# Patient Record
Sex: Female | Born: 1995 | Race: Black or African American | Hispanic: No | Marital: Single | Smoking: Never smoker
Health system: Southern US, Community
[De-identification: ages and names within clinical notes are randomized; demographics above are authoritative.]

## PROBLEM LIST (undated history)

## (undated) ENCOUNTER — Inpatient Hospital Stay (HOSPITAL_COMMUNITY): Payer: Self-pay

## (undated) DIAGNOSIS — A64 Unspecified sexually transmitted disease: Secondary | ICD-10-CM

## (undated) DIAGNOSIS — A749 Chlamydial infection, unspecified: Secondary | ICD-10-CM

## (undated) DIAGNOSIS — B009 Herpesviral infection, unspecified: Secondary | ICD-10-CM

## (undated) HISTORY — PX: OTHER SURGICAL HISTORY: SHX169

## (undated) HISTORY — DX: Herpesviral infection, unspecified: B00.9

## (undated) HISTORY — DX: Chlamydial infection, unspecified: A74.9

## (undated) HISTORY — DX: Unspecified sexually transmitted disease: A64

---
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
## 2017-03-22 ENCOUNTER — Telehealth: Payer: Self-pay | Admitting: Obstetrics and Gynecology

## 2017-03-22 NOTE — Telephone Encounter (Signed)
Called and left a message for patient to call back to schedule a new patient doctor referral appointment with our office. °

## 2017-03-28 NOTE — Telephone Encounter (Signed)
Called and left a message for patient to call back to schedule a new patient doctor referral appointment with our office. °

## 2017-04-01 NOTE — Telephone Encounter (Signed)
Called and left a message for patient to call back to schedule a new patient doctor referral appointment with our office. °

## 2017-04-04 NOTE — Telephone Encounter (Signed)
Routing back to referring office patient has not returned calls to schedule.

## 2017-04-07 ENCOUNTER — Ambulatory Visit (INDEPENDENT_AMBULATORY_CARE_PROVIDER_SITE_OTHER): Payer: 59 | Admitting: Obstetrics & Gynecology

## 2017-04-07 ENCOUNTER — Other Ambulatory Visit: Payer: Self-pay

## 2017-04-07 ENCOUNTER — Encounter: Payer: Self-pay | Admitting: Obstetrics & Gynecology

## 2017-04-07 VITALS — BP 110/64 | HR 88 | Resp 16 | Ht 61.25 in | Wt 137.0 lb

## 2017-04-07 DIAGNOSIS — N926 Irregular menstruation, unspecified: Secondary | ICD-10-CM | POA: Diagnosis not present

## 2017-04-07 DIAGNOSIS — Z975 Presence of (intrauterine) contraceptive device: Secondary | ICD-10-CM

## 2017-04-07 NOTE — Progress Notes (Signed)
GYNECOLOGY  VISIT  CC:   Discuss contraception options  HPI: 21 y.o. [id number] Single African American female here for nexplanon consult.  Pt has Nexplanon placed about a year and a half ago.  For the first year, she had amenorrhea.  Now, however, her bleeding has resumed and it is irregular.  She can bleed twice in a month or bleeding/spot for 10-14 days.  Is not predictable.  Frustrating for her.  Also, feels like her Nexplanon has moved since placement.  Wants to know if this is related.  Typical bleeding patterns with Nexplanon reviewed.  Frequency of irregular bleeding discussed.  Options for treatment reviewed as well as removal and replacement versus alternative options reviewed.  Specifically other hormonal methods reviewed as well as barrier method IUD use.  She is considering an IUD.    Pap done 03/15/17.  This was normal per her hx.  Has had STD testing in 3/18.  This was negative.  GYNECOLOGIC HISTORY: Patient's last menstrual period was 01/31/2017 (approximate). Contraception: nexplanon  Menopausal hormone therapy: none  There are no active problems to display for this patient.   Past Medical History:  Diagnosis Date  . Chlamydia   . HSV-1 (herpes simplex virus 1) infection     History reviewed. No pertinent surgical history.  MEDS:   Current Outpatient Medications on File Prior to Visit  Medication Sig Dispense Refill  . etonogestrel (IMPLANON) 68 MG IMPL implant Placed 07/2015    . fluticasone (FLONASE) 50 MCG/ACT nasal spray daily as needed.     No current facility-administered medications on file prior to visit.     ALLERGIES: Penicillins and Sulfa antibiotics  History reviewed. No pertinent family history.  SH:  Single, non smoker  Review of Systems  All other systems reviewed and are negative.   PHYSICAL EXAMINATION:    BP 110/64 (BP Location: Right Arm, Patient Position: Sitting, Cuff Size: Normal)   Pulse 88   Resp 16   Ht 5' 1.25" (1.556 m)   Wt  137 lb (62.1 kg)   LMP 01/31/2017 (Approximate)   BMI 25.68 kg/m     General appearance: alert, cooperative and appears stated age Left arm:  Nexplaonn easily identified in left mararnm     Assessment: Irregular bleeding with Nexpalnon  Plan: Pt will consider options and let me know what she want sto do.

## 2017-04-08 ENCOUNTER — Telehealth: Payer: Self-pay | Admitting: *Deleted

## 2017-04-08 NOTE — Telephone Encounter (Signed)
Detailed message left per DPR letting patient know pap smear [facility]. See scanned copy. Advised to return call if any questions.   Will close encounter.

## 2017-04-10 DIAGNOSIS — Z975 Presence of (intrauterine) contraceptive device: Secondary | ICD-10-CM | POA: Insufficient documentation

## 2017-04-14 ENCOUNTER — Telehealth: Payer: Self-pay | Admitting: Obstetrics & Gynecology

## 2017-04-14 NOTE — Telephone Encounter (Signed)
Spoke with patient regarding benefits for nexplanon removal, possible nexplanon reinsertion and possible Kyleena IUD insertion. Patient understood information presented. Patient states she will call back today to advise which contraceptive device she would like to proceed with and to schedule appointment. Patient does desire to schedule before the end of the year.   cc: Dr Hyacinth MeekerMiller

## 2017-04-19 ENCOUNTER — Telehealth: Payer: Self-pay | Admitting: Obstetrics & Gynecology

## 2017-04-19 DIAGNOSIS — Z3046 Encounter for surveillance of implantable subdermal contraceptive: Secondary | ICD-10-CM

## 2017-04-19 DIAGNOSIS — Z30430 Encounter for insertion of intrauterine contraceptive device: Secondary | ICD-10-CM

## 2017-04-19 NOTE — Telephone Encounter (Signed)
Patient is interested in having her Nexplanon removed and an IUD inserted.

## 2017-04-19 NOTE — Telephone Encounter (Signed)
Left message to call Mckenzi Buonomo at [phone].  

## 2017-04-20 NOTE — Telephone Encounter (Signed)
Patient returning call from Jill. °

## 2017-04-20 NOTE — Telephone Encounter (Signed)
Spoke with patient, scheduled for Nexplanon removal and Kyleena IUD placement on 05/02/17 at 3pm with Dr. Hyacinth MeekerMiller. Advised to take Motrin 800 mg with food and water one hour before procedure. Patient verbalizes understanding and is agreeable.   Orders placed for IUD insertion and implanon removal.  Routing to provider for final review. Patient is agreeable to disposition. Will close encounter.   Cc: Harland DingwallSuzy Dixon

## 2017-04-20 NOTE — Telephone Encounter (Signed)
Left message to call Dalesha Stanback at [phone].  

## 2017-04-20 NOTE — Telephone Encounter (Signed)
Left message to call Efren Kross at [phone].  

## 2017-04-20 NOTE — Telephone Encounter (Signed)
Returning a call to Julie H. °

## 2017-05-02 ENCOUNTER — Encounter: Payer: Self-pay | Admitting: Obstetrics & Gynecology

## 2017-05-02 ENCOUNTER — Ambulatory Visit (INDEPENDENT_AMBULATORY_CARE_PROVIDER_SITE_OTHER): Payer: 59 | Admitting: Obstetrics & Gynecology

## 2017-05-02 VITALS — BP 110/60 | HR 84 | Resp 16 | Ht 61.25 in | Wt 138.0 lb

## 2017-05-02 DIAGNOSIS — Z3046 Encounter for surveillance of implantable subdermal contraceptive: Secondary | ICD-10-CM | POA: Diagnosis not present

## 2017-05-02 DIAGNOSIS — Z308 Encounter for other contraceptive management: Secondary | ICD-10-CM

## 2017-05-02 NOTE — Progress Notes (Signed)
21 yrs African American Singlefemale G1P0010 presents for Nexplanon removal due to DUB.  Does not want to start back on any hormonal therapy at this time.  Just wants to wait and see if cycles straighten out on own.  Knows she does not have any contraception after removal.  Will use condoms.    Questions addressed.  Consent signed. Requests same as above.  Past Medical History:  Diagnosis Date  . Chlamydia 2015  . HSV-1 (herpes simplex virus 1) infection    Current Outpatient Medications on File Prior to Visit  Medication Sig Dispense Refill  . etonogestrel (IMPLANON) 68 MG IMPL implant Placed 07/2015    . fluticasone (FLONASE) 50 MCG/ACT nasal spray daily as needed.     No current facility-administered medications on file prior to visit.    Allergies  Allergen Reactions  . Penicillins Anaphylaxis  . Sulfa Antibiotics Anaphylaxis and Hives   Exam: WNWD AAF, NAD  Procedure: Patient placed supine on exam table with herleft arm flexed at the elbow and externally rotated. The insertion site was identified on the inner side of upper arm. The device was palpated. Incision site for removal was marked. The removal site was cleansed with betadine solution and 2 ml of 1% Lidocaine was injected at the incision site.  Lot: [id number].  Exp:  11/21.  A small 2 mm incision was made at the distal end of the implant. The/Nexaplon implant was grasped with curved forceps and removed gently intact. The implant was shown to the patient and discarded. The incision was closed with a steri strip.  No active bleeding was noted. A small adhesive bandage placed over the removal site.  Pt tolerated procedure well.  Assessment: Nexaplon removal due to DUB Contraception plans are condoms  Plan :Instructions and warning signs and symptoms given.  Pt will remove bandage in 24 hours and let stri-strips come off on own.  Knows to call with any concerns or if changes mind about contraception choices.

## 2017-06-07 ENCOUNTER — Ambulatory Visit (INDEPENDENT_AMBULATORY_CARE_PROVIDER_SITE_OTHER): Payer: 59 | Admitting: Certified Nurse Midwife

## 2017-06-07 ENCOUNTER — Other Ambulatory Visit: Payer: Self-pay

## 2017-06-07 ENCOUNTER — Encounter: Payer: Self-pay | Admitting: Certified Nurse Midwife

## 2017-06-07 VITALS — BP 100/64 | HR 68 | Resp 16 | Ht 61.25 in | Wt 140.0 lb

## 2017-06-07 DIAGNOSIS — N898 Other specified noninflammatory disorders of vagina: Secondary | ICD-10-CM | POA: Diagnosis not present

## 2017-06-07 NOTE — Patient Instructions (Signed)

## 2017-06-07 NOTE — Progress Notes (Signed)
22 y.o. Single African American female G1P0010 here with complaint of vaginal symptoms of vaginal odor, and increase discharge. Describes discharge as white but odorous for the past 4-5 days.  Denies new personal products except for new body wash and then began with symptoms after use.No STD concerns. Urinary symptoms none. Contraception is condoms. No other concerns today.  ROS Pertinent to above  O:Healthy female WDWN Affect: normal, orientation x 3  Exam:Skin: warm and dry Abdomen:soft, non tender  Inguinal Lymph nodes: no enlargement or tenderness Pelvic exam: External genital: normal female, no lesions or scaling or exudate BUS: negative Vagina: beige copious odorous discharge with blood present(end of period) noted.  Affirm taken Cervix: normal, non tender, no CMT Uterus: normal, non tender, no masses Adnexa:normal, non tender, no masses or fullness noted  A:Normal pelvic exam Vaginal discharge increase and odor, new body wash use   P:Discussed findings of normal pelvic exam and vaginal discharge noted and possible new body wash as the  Etiology of odor. Discussed Aveeno or baking soda sitz bath for comfort. Avoid moist clothes or pads for extended period of time. If working out in gym clothes for long periods of time change underwear if possible.  Questions addressed. Lab: Affirm Will call results and treat as indicated.  Rv prn

## 2017-06-08 ENCOUNTER — Other Ambulatory Visit: Payer: Self-pay

## 2017-06-08 LAB — VAGINITIS/VAGINOSIS, DNA PROBE
CANDIDA SPECIES: NEGATIVE
GARDNERELLA VAGINALIS: POSITIVE — AB
Trichomonas vaginosis: NEGATIVE

## 2017-06-08 MED ORDER — METRONIDAZOLE 500 MG PO TABS: 500.0000 mg | ORAL_TABLET | Freq: Two times a day (BID) | ORAL | 0 refills | Status: DC

## 2017-06-30 ENCOUNTER — Encounter: Payer: Self-pay | Admitting: Obstetrics and Gynecology

## 2017-07-25 ENCOUNTER — Emergency Department (HOSPITAL_COMMUNITY): Admission: EM | Admit: 2017-07-25 | Discharge: 2017-07-25 | Discharge status: Against Medical Advice | Payer: 59

## 2017-08-22 ENCOUNTER — Encounter: Payer: Self-pay | Admitting: Obstetrics and Gynecology

## 2017-08-23 ENCOUNTER — Telehealth: Payer: Self-pay | Admitting: Certified Nurse Midwife

## 2017-08-23 ENCOUNTER — Telehealth: Payer: Self-pay | Admitting: Obstetrics and Gynecology

## 2017-08-23 ENCOUNTER — Ambulatory Visit: Payer: Self-pay | Admitting: Obstetrics and Gynecology

## 2017-08-23 ENCOUNTER — Telehealth: Payer: Self-pay | Admitting: Obstetrics & Gynecology

## 2017-08-23 NOTE — Telephone Encounter (Signed)
Patient having severe cramps for the past few days, cycle is not due until Sunday.

## 2017-08-23 NOTE — Telephone Encounter (Signed)
Left message to call Kaitlyn at [phone]. 

## 2017-08-23 NOTE — Telephone Encounter (Signed)
Patient canceled her appointment this afternoon. Patient just scheduled this appointment and has decided to see her PCP the in morning.

## 2017-08-23 NOTE — Telephone Encounter (Addendum)
Requested OV notes and labs from 08/22/17 OV be faxed to [phone].    Routing to provider for final review. Will close encounter.

## 2017-08-23 NOTE — Telephone Encounter (Signed)
Spoke with patient. Reports  squeezing, stabbing, pelvic pain since 08/19/17. Currently 3/10, worse at night. N/V when pain is severe. Tylenol prn, provides some relief. White vaginal d/c, no odor. LMP 07/29/17. SA, no contraceptive. Denies STD concerns.   Seen by Urgent Care on 08/20/17. Reports normal UA, has not received call regarding pregnancy test.   [facility] - swab for STD testing completed, has not been called with results. Pregnancy test negative.   OV scheduled with Dr. Oscar LaJertson on 4/24 at 1:45pm. Advised [facility] and labs. ER precautions provided. Will review with Dr. Oscar LaJertson and return call with any additional recommendations.

## 2017-08-23 NOTE — Telephone Encounter (Signed)
OV notes and labs received from. Reviewed with Dr. Oscar LaJertson, OV recommended for today. Call returned to patient, advised of positive chlamydia result,  OV recommended today for further evaluation and treatment. Patient scheduled for 4:15pm today with Dr. Oscar LaJertson, is aware will be worked into schedule.   Routing to provider for final review. Patient is agreeable to disposition. Will close encounter.

## 2017-08-24 ENCOUNTER — Ambulatory Visit: Payer: 59 | Admitting: Obstetrics and Gynecology

## 2017-08-24 NOTE — Telephone Encounter (Signed)
Left message for patient to call back. Need to be sure patient has been treated for chlamydia and to see how pelvic pain is -eh

## 2017-08-26 NOTE — Telephone Encounter (Signed)
Spoke with patient and her PCP treated her for the chlamydia. She is scheduled to follow up with us in 2 weeks -eh

## 2017-09-08 ENCOUNTER — Ambulatory Visit: Payer: 59 | Admitting: Obstetrics and Gynecology

## 2017-10-05 ENCOUNTER — Telehealth: Payer: Self-pay | Admitting: *Deleted

## 2017-10-05 NOTE — Telephone Encounter (Signed)
Left message to call Noreene LarssonJill at [phone].    [facility]. Treated by PCP, needs to schedule 3 mo f/u with Dr. Oscar LaJertson July 2019.

## 2017-10-05 NOTE — Telephone Encounter (Signed)
Spoke with patient, OV scheduled for 11/02/17 at 4pm with Dr. Oscar LaJertson.   Lab results to scan.  Routing to provider for final review. Patient is agreeable to disposition. Will close encounter.

## 2017-10-18 ENCOUNTER — Telehealth: Payer: Self-pay | Admitting: Obstetrics and Gynecology

## 2017-10-18 NOTE — Telephone Encounter (Signed)
Spoke with patient. Patient previously scheduled for OV for TOC/Chlamydia on 11/02/17. Patient requesting to discuss how chlamydia may effect fertility, "possible scarring" from chlamydia and if any additional testing may be needed in regard to fertility.   Advised patient OV recommended for further discussion with Dr. Oscar LaJertson. Patient declined earlier OV, will discuss at 7/3 OV.   Routing to provider for final review. Patient is agreeable to disposition. Will close encounter.

## 2017-10-18 NOTE — Telephone Encounter (Signed)
Patient has questions for nurse about her upcoming appointment in July.

## 2017-11-02 ENCOUNTER — Ambulatory Visit: Payer: 59 | Admitting: Obstetrics and Gynecology

## 2017-11-07 NOTE — Progress Notes (Deleted)
GYNECOLOGY  VISIT   HPI: 22 y.o.   Single  African American  female   G1P0010 with No LMP recorded.   here for 3 month STD follow up.  GYNECOLOGIC HISTORY: No LMP recorded. Contraception:Condoms Menopausal hormone therapy: None        OB History    Gravida  1   Para      Term      Preterm      AB  1   Living        SAB  1   TAB      Ectopic      Multiple      Live Births                 There are no active problems to display for this patient.   Past Medical History:  Diagnosis Date  . Chlamydia 2015  . HSV-1 (herpes simplex virus 1) infection     Past Surgical History:  Procedure Laterality Date  . nexplanon removed     removed 05-02-17    Current Outpatient Medications  Medication Sig Dispense Refill  . fluticasone (FLONASE) 50 MCG/ACT nasal spray daily as needed.    . metroNIDAZOLE (FLAGYL) 500 MG tablet Take 1 tablet (500 mg total) by mouth 2 (two) times daily. 14 tablet 0   No current facility-administered medications for this visit.      ALLERGIES: Penicillins and Sulfa antibiotics  No family history on file.  Social History   Socioeconomic History  . Marital status: Single    Spouse name: Not on file  . Number of children: Not on file  . Years of education: Not on file  . Highest education level: Not on file  Occupational History  . Not on file  Social Needs  . Financial resource strain: Not on file  . Food insecurity:    Worry: Not on file    Inability: Not on file  . Transportation needs:    Medical: Not on file    Non-medical: Not on file  Tobacco Use  . Smoking status: Never Smoker  . Smokeless tobacco: Never Used  Substance and Sexual Activity  . Alcohol use: Yes    Alcohol/week: 1.8 oz    Types: 3 Standard drinks or equivalent per week    Frequency: Never  . Drug use: No  . Sexual activity: Yes    Partners: Male    Comment: condoms occ  Lifestyle  . Physical activity:    Days per week: Not on file   Minutes per session: Not on file  . Stress: Not on file  Relationships  . Social connections:    Talks on phone: Not on file    Gets together: Not on file    Attends religious service: Not on file    Active member of club or organization: Not on file    Attends meetings of clubs or organizations: Not on file    Relationship status: Not on file  . Intimate partner violence:    Fear of current or ex partner: Not on file    Emotionally abused: Not on file    Physically abused: Not on file    Forced sexual activity: Not on file  Other Topics Concern  . Not on file  Social History Narrative  . Not on file    ROS  PHYSICAL EXAMINATION:    There were no vitals taken for this visit.    General appearance: alert, cooperative and  appears stated age Neck: no adenopathy, supple, symmetrical, trachea midline and thyroid {CHL AMB PHY EX THYROID NORM DEFAULT:[phone]::"normal to inspection and palpation"} Breasts: {Exam; breast:13139::"normal appearance, no masses or tenderness"} Abdomen: soft, non-tender; non distended, no masses,  no organomegaly  Pelvic: External genitalia:  no lesions              Urethra:  normal appearing urethra with no masses, tenderness or lesions              Bartholins and Skenes: normal                 Vagina: normal appearing vagina with normal color and discharge, no lesions              Cervix: {CHL AMB PHY EX CERVIX NORM DEFAULT:[phone]::"no lesions"}              Bimanual Exam:  Uterus:  {CHL AMB PHY EX UTERUS NORM [phone]::"normal size, contour, position, consistency, mobility, non-tender"}              Adnexa: {CHL AMB PHY EX ADNEXA NO MASS DEFAULT:[phone]::"no mass, fullness, tenderness"}              Rectovaginal: {yes no:[id number]}.  Confirms.              Anus:  normal sphincter tone, no lesions  Chaperone was present for exam.  ASSESSMENT     PLAN    An After Visit Summary was printed and given to the patient.  *** minutes  face to face time of which over 50% was spent in counseling.

## 2017-11-08 ENCOUNTER — Ambulatory Visit: Payer: 59 | Admitting: Obstetrics and Gynecology

## 2017-11-16 NOTE — Progress Notes (Signed)
GYNECOLOGY  VISIT   HPI: 22 y.o.   Single  African American  female   G1P0010 with Patient's last menstrual period was 10/25/2017 (exact date).   here for  3 month follow up for positive chlamydia. The patient [facility]. She and her partner were both treated. Not using condoms, using w/d for contraception. Planning on having babies in the next 1-2 years.  She is very worried about scarring of her tubes. Cycles are monthly x 4 days. Saturates a pad in 2-3 x a day. Cramps are tolerable. She has some ovulatory pain.  She has some intermittent pelvic cramping just prior to her cycles. Occasionally at other times. No dyspareunia.     GYNECOLOGIC HISTORY: Patient's last menstrual period was 10/25/2017 (exact date). Contraception:None Menopausal hormone therapy: None        OB History    Gravida  1   Para      Term      Preterm      AB  1   Living        SAB  1   TAB      Ectopic      Multiple      Live Births                 There are no active problems to display for this patient.   Past Medical History:  Diagnosis Date  . Chlamydia 2015  . HSV-1 (herpes simplex virus 1) infection     Past Surgical History:  Procedure Laterality Date  . nexplanon removed     removed 05-02-17    Current Outpatient Medications  Medication Sig Dispense Refill  . fluticasone (FLONASE) 50 MCG/ACT nasal spray daily as needed.    . metroNIDAZOLE (FLAGYL) 500 MG tablet Take 1 tablet (500 mg total) by mouth 2 (two) times daily. 14 tablet 0   No current facility-administered medications for this visit.      ALLERGIES: Penicillins and Sulfa antibiotics  No family history on file.  Social History   Socioeconomic History  . Marital status: Single    Spouse name: Not on file  . Number of children: Not on file  . Years of education: Not on file  . Highest education level: Not on file  Occupational History  . Not on file  Social Needs  . Financial  resource strain: Not on file  . Food insecurity:    Worry: Not on file    Inability: Not on file  . Transportation needs:    Medical: Not on file    Non-medical: Not on file  Tobacco Use  . Smoking status: Never Smoker  . Smokeless tobacco: Never Used  Substance and Sexual Activity  . Alcohol use: Yes    Alcohol/week: 1.8 oz    Types: 3 Standard drinks or equivalent per week    Frequency: Never  . Drug use: No  . Sexual activity: Yes    Partners: Male    Comment: condoms occ  Lifestyle  . Physical activity:    Days per week: Not on file    Minutes per session: Not on file  . Stress: Not on file  Relationships  . Social connections:    Talks on phone: Not on file    Gets together: Not on file    Attends religious service: Not on file    Active member of club or organization: Not on file    Attends meetings of  clubs or organizations: Not on file    Relationship status: Not on file  . Intimate partner violence:    Fear of current or ex partner: Not on file    Emotionally abused: Not on file    Physically abused: Not on file    Forced sexual activity: Not on file  Other Topics Concern  . Not on file  Social History Narrative  . Not on file    Review of Systems  Constitutional: Negative.   HENT: Negative.   Eyes: Negative.   Respiratory: Negative.   Cardiovascular: Negative.   Gastrointestinal: Negative.   Genitourinary: Negative.   Musculoskeletal: Negative.   Skin: Negative.   Neurological: Negative.   Endo/Heme/Allergies: Negative.   Psychiatric/Behavioral: Negative.     PHYSICAL EXAMINATION:    BP 100/70 (BP Location: Right Arm, Patient Position: Sitting)   Pulse 76   Wt 136 lb 12.8 oz (62.1 kg)   LMP 10/25/2017 (Exact Date)   BMI 25.64 kg/m     General appearance: alert, cooperative and appears stated age  Pelvic: External genitalia:  no lesions              Urethra:  normal appearing urethra with no masses, tenderness or lesions               Bartholins and Skenes: normal                 Vagina: normal appearing vagina with normal color, slight increase in creamy, white vaginal d/c (patient declines symptoms             Cervix: no cervical motion tenderness and no lesions              Bimanual Exam:  Uterus:  normal size, contour, position, consistency, mobility, non-tender              Adnexa: no mass, fullness, tenderness               Chaperone was present for exam.  ASSESSMENT H/O chlamydia, s/p treatment STD testing Discussed risks of tubal scarring after chlamydia, discussed risk of ectopic pregnancy and need for early evaluation when pregnant Contraception, only using w/d, okay if she gets pregnant. Declines other contraception    PLAN STD testing Take a multivitamin with folic acid Call with +UPT Discussed that we don't evaluate for tubal damage unless she goes a year with unprotected intercourse without pregnancy   An After Visit Summary was printed and given to the patient.  ~15 minutes face to face time of which over 50% was spent in counseling.

## 2017-11-17 ENCOUNTER — Ambulatory Visit (INDEPENDENT_AMBULATORY_CARE_PROVIDER_SITE_OTHER): Payer: 59 | Admitting: Obstetrics and Gynecology

## 2017-11-17 ENCOUNTER — Encounter: Payer: Self-pay | Admitting: Obstetrics and Gynecology

## 2017-11-17 ENCOUNTER — Other Ambulatory Visit: Payer: Self-pay

## 2017-11-17 VITALS — BP 100/70 | HR 76 | Wt 136.8 lb

## 2017-11-17 DIAGNOSIS — Z8619 Personal history of other infectious and parasitic diseases: Secondary | ICD-10-CM

## 2017-11-17 DIAGNOSIS — Z113 Encounter for screening for infections with a predominantly sexual mode of transmission: Secondary | ICD-10-CM | POA: Diagnosis not present

## 2017-11-17 DIAGNOSIS — Z3009 Encounter for other general counseling and advice on contraception: Secondary | ICD-10-CM | POA: Diagnosis not present

## 2017-11-17 NOTE — Patient Instructions (Signed)
Chlamydia, Female Chlamydia is an STD (sexually transmitted disease). It is a bacterial infection that spreads (is contagious) through sexual contact. Chlamydia can occur in different areas of the body, including:  The tube that moves urine from the bladder out of the body (urethra).  The lower part of the uterus (cervix).  The throat.  The rectum.  This condition is not difficult to treat. However, if left untreated, chlamydia can lead to more serious health problems, including pelvic inflammatory disorder (PID). PID can increase your risk of not being able to have children (sterility). What are the causes? Chlamydia is caused by the bacteria Chlamydia trachomatis. It is passed from an infected partner during sexual activity. Chlamydia can spread through contact with the genitals, mouth, or rectum. What are the signs or symptoms? In some cases, there may not be any symptoms for this condition (asymptomatic), especially early in the infection. If symptoms develop, they may include:  Burning with urination.  Frequent urination.  Vaginal discharge.  Redness, soreness, and swelling (inflammation) of the rectum.  Bleeding or discharge from the rectum.  Abdominal pain.  Pain during sexual intercourse.  Bleeding between menstrual periods.  Itching, burning, or redness in the eyes, or discharge from the eyes.  How is this diagnosed? This condition may be diagnosed with:  Urine tests.  Swab tests. Depending on your symptoms, your health care provider may use a cotton swab to collect discharge from your vagina or rectum to test for the bacteria.  A pelvic exam.  How is this treated? This condition is treated with antibiotic medicines. If you are pregnant, certain types of antibiotics will need to be avoided. Follow these instructions at home: Medicines  Take over-the-counter and prescription medicines only as told by your health care provider.  Take your antibiotic medicine  as told by your health care provider. Do not stop taking the antibiotic even if you start to feel better. Sexual activity  Tell sexual partners about your infection. This includes any oral, anal, or vaginal sex partners you have had within 60 days of when your symptoms started. Sexual partners should also be treated, even if they have no signs of the disease.  Do not have sex until you and your sexual partners have completed treatment and your health care provider says it is okay. If your health care provider prescribed you a single dose treatment, wait 7 days after taking the treatment before having sex. General instructions  It is your responsibility to get your test results. Ask your health care provider, or the department performing the test, when your results will be ready.  Get plenty of rest.  Eat a healthy, well-balanced diet.  Drink enough fluids to keep your urine clear or pale yellow.  Keep all follow-up visits as told by your health care provider. This is important. You may need to be tested for infection again 3 months after treatment. How is this prevented? The only sure way to prevent chlamydia is to avoid having sex. However, you can lower your risk by:  Using latex condoms correctly every time you have sex.  Not having multiple sexual partners.  Asking if your sexual partner has been tested for STIs and had negative results.  Contact a health care provider if:  You develop new symptoms or your symptoms do not get better after completing treatment.  You have a fever or chills.  You have pain during sexual intercourse. Get help right away if:  Your pain gets worse and does   not get better with medicine.  You develop flu-like symptoms, such as night sweats, sore throat, or muscle aches.  You experience nausea or vomiting.  You have difficulty swallowing.  You have bleeding between periods or after sex.  You have irregular menstrual periods.  You have  abdominal or lower back pain that does not get better with medicine.  You feel weak or dizzy, or you faint.  You are pregnant and you develop symptoms of chlamydia. Summary  Chlamydia is an STD (sexually transmitted disease). It is a bacterial infection that spreads (is contagious) through sexual contact.  This condition is not difficult to treat, however. If left untreated, chlamydia can lead to more serious health problems, including pelvic inflammatory disease (PID).  In some cases, there may not be any symptoms for this condition (asymptomatic).  This condition is treated with antibiotic medicines.  Using latex condoms correctly every time you have sex can help prevent chlamydia. This information is not intended to replace advice given to you by your health care provider. Make sure you discuss any questions you have with your health care provider. Document Released: 01/27/2005 Document Revised: 04/05/2016 Document Reviewed: 04/05/2016 Elsevier Interactive Patient Education  2018 [employer]  

## 2017-11-18 LAB — CHLAMYDIA/GONOCOCCUS/TRICHOMONAS, NAA
CHLAMYDIA BY NAA: NEGATIVE
GONOCOCCUS BY NAA: NEGATIVE
Trich vag by NAA: NEGATIVE

## 2017-11-18 LAB — HEP, RPR, HIV PANEL
HEP B S AG: NEGATIVE
HIV SCREEN 4TH GENERATION: NONREACTIVE
RPR Ser Ql: NONREACTIVE

## 2017-11-18 LAB — HEPATITIS C ANTIBODY

## 2018-01-09 ENCOUNTER — Telehealth: Payer: Self-pay | Admitting: Obstetrics and Gynecology

## 2018-01-09 ENCOUNTER — Ambulatory Visit (INDEPENDENT_AMBULATORY_CARE_PROVIDER_SITE_OTHER): Payer: 59 | Admitting: Obstetrics and Gynecology

## 2018-01-09 ENCOUNTER — Encounter: Payer: Self-pay | Admitting: Obstetrics and Gynecology

## 2018-01-09 VITALS — BP 116/80 | HR 84 | Resp 14 | Wt 140.0 lb

## 2018-01-09 DIAGNOSIS — R61 Generalized hyperhidrosis: Secondary | ICD-10-CM

## 2018-01-09 DIAGNOSIS — N940 Mittelschmerz: Secondary | ICD-10-CM | POA: Diagnosis not present

## 2018-01-09 DIAGNOSIS — Z113 Encounter for screening for infections with a predominantly sexual mode of transmission: Secondary | ICD-10-CM

## 2018-01-09 DIAGNOSIS — R140 Abdominal distension (gaseous): Secondary | ICD-10-CM

## 2018-01-09 DIAGNOSIS — R110 Nausea: Secondary | ICD-10-CM | POA: Diagnosis not present

## 2018-01-09 DIAGNOSIS — R102 Pelvic and perineal pain: Secondary | ICD-10-CM

## 2018-01-09 LAB — POCT URINE PREGNANCY: Preg Test, Ur: NEGATIVE

## 2018-01-09 NOTE — Telephone Encounter (Signed)
Patient called requesting to speak with a nurse about some symptoms she is having including which include night sweats and also bad cramps around her menstrual cycle.

## 2018-01-09 NOTE — Progress Notes (Signed)
GYNECOLOGY  VISIT   HPI: 22 y.o.   Single  African American  female   G1P0010 with Patient's last menstrual period was 12/25/2017.   here for nausea, increase pain and bloating during menses.    She has been off of contraception in 1/19. Cycles q month x 4-5 days. Saturates a regular tampon 3-4 x a day. Cramps are tolerable.  She c/o midcyle pain, getting worse each month for the last few months. The pain is associated with bloating and nausea. This month the pain was on the left, not sure about before. Symptoms last a couple of days. The pain is constant for a day or so, up to a 5/10 in severity. Ibuprofen helps the pain. Right prior to her cycle she can have some nausea and some intermittent cramps.  Using w/d for contraception, okay if she does get pregnant.  She c/o some night sweats in the last week. No fevers. Same sexual partner, negative STD testing in 7/19. She would like repeat cervical cultures  GYNECOLOGIC HISTORY: Patient's last menstrual period was 12/25/2017. Contraception:none Menopausal hormone therapy: none        OB History    Gravida  1   Para      Term      Preterm      AB  1   Living        SAB  1   TAB      Ectopic      Multiple      Live Births                 There are no active problems to display for this patient.   Past Medical History:  Diagnosis Date  . Chlamydia 2015  . HSV-1 (herpes simplex virus 1) infection   . STD (sexually transmitted disease)     Past Surgical History:  Procedure Laterality Date  . nexplanon removed     removed 05-02-17    Current Outpatient Medications  Medication Sig Dispense Refill  . fluticasone (FLONASE) 50 MCG/ACT nasal spray daily as needed.     No current facility-administered medications for this visit.      ALLERGIES: Penicillins and Sulfa antibiotics  No family history on file.  Social History   Socioeconomic History  . Marital status: Single    Spouse name: Not on file  .  Number of children: Not on file  . Years of education: Not on file  . Highest education level: Not on file  Occupational History  . Not on file  Social Needs  . Financial resource strain: Not on file  . Food insecurity:    Worry: Not on file    Inability: Not on file  . Transportation needs:    Medical: Not on file    Non-medical: Not on file  Tobacco Use  . Smoking status: Never Smoker  . Smokeless tobacco: Never Used  Substance and Sexual Activity  . Alcohol use: Yes    Alcohol/week: 3.0 standard drinks    Types: 3 Standard drinks or equivalent per week    Frequency: Never  . Drug use: No  . Sexual activity: Yes    Partners: Male    Birth control/protection: None  Lifestyle  . Physical activity:    Days per week: Not on file    Minutes per session: Not on file  . Stress: Not on file  Relationships  . Social connections:    Talks on phone: Not on file  Gets together: Not on file    Attends religious service: Not on file    Active member of club or organization: Not on file    Attends meetings of clubs or organizations: Not on file    Relationship status: Not on file  . Intimate partner violence:    Fear of current or ex partner: Not on file    Emotionally abused: Not on file    Physically abused: Not on file    Forced sexual activity: Not on file  Other Topics Concern  . Not on file  Social History Narrative  . Not on file    Review of Systems  Constitutional: Negative.   HENT: Negative.   Eyes: Negative.   Respiratory: Negative.   Cardiovascular: Negative.   Gastrointestinal:       Bloating pain  Genitourinary: Negative.   Musculoskeletal: Negative.   Skin: Negative.   Neurological: Negative.   Endo/Heme/Allergies:       Excessive sweating  Psychiatric/Behavioral: Negative.   All other systems reviewed and are negative.   PHYSICAL EXAMINATION:    Wt 140 lb (63.5 kg)   LMP 12/25/2017   BMI 26.24 kg/m     General appearance: alert,  cooperative and appears stated age Neck: no adenopathy, supple, symmetrical, trachea midline and thyroid normal to inspection and palpation Abdomen: soft, mildly tender LLQ, no rebound, no guarding; non distended, no masses,  no organomegaly  Pelvic: External genitalia:  no lesions              Urethra:  normal appearing urethra with no masses, tenderness or lesions              Bartholins and Skenes: normal                 Vagina: normal appearing vagina with normal color and discharge, no lesions              Cervix: no cervical motion tenderness and no lesions              Bimanual Exam:  Uterus:  normal size, contour, position, consistency, mobility, non-tender              Adnexa: no masses, tender in the left adnexa                Chaperone was present for exam.  ASSESSMENT Mid cycle pain for the last several months Pre-menstrual cramps Intermittent nausea, bloating Screening std Contraception, only using W/D, declines other options    PLAN Calendar pain, f/u in 3 months, sooner if pain is worsening or persistent. We discussed possible ultrasound Take ibuprofen, use heating pad as needed Declines OCP's Take PNV  Genprobe, HIV, RPR UPT negative   An After Visit Summary was printed and given to the patient.  ~25 minutes face to face time of which over 50% was spent in counseling.

## 2018-01-09 NOTE — Telephone Encounter (Signed)
Spoke with patient. Reports nausea, increased sweating during day and night, increased pain and bloating with ovulation. Ongoing for last 2-3 months, worse last night, 5/10. Took 200 mg motrin and was able to sleep. LMP 8/25-8/29, describes flow as normal. Not currently trying for pregnancy, no contraceptive.  Has not taken UPT.   Denies fever/chills, vomiting, vaginal d/c or odor.   Recommended OV for further evaluation, OV scheduled for today at 3:15pm with Dr. Oscar La. Patient verbalizes understanding and is agreeable. Encounter closed.

## 2018-01-10 LAB — CBC WITH DIFFERENTIAL/PLATELET
BASOS: 0 %
Basophils Absolute: 0 10*3/uL (ref 0.0–0.2)
EOS (ABSOLUTE): 0 10*3/uL (ref 0.0–0.4)
EOS: 0 %
HEMATOCRIT: 34.1 % (ref 34.0–46.6)
HEMOGLOBIN: 11.4 g/dL (ref 11.1–15.9)
Immature Grans (Abs): 0 10*3/uL (ref 0.0–0.1)
Immature Granulocytes: 0 %
LYMPHS ABS: 3.1 10*3/uL (ref 0.7–3.1)
Lymphs: 59 %
MCH: 23.2 pg — AB (ref 26.6–33.0)
MCHC: 33.4 g/dL (ref 31.5–35.7)
MCV: 69 fL — ABNORMAL LOW (ref 79–97)
MONOCYTES: 7 %
Monocytes Absolute: 0.4 10*3/uL (ref 0.1–0.9)
Neutrophils Absolute: 1.8 10*3/uL (ref 1.4–7.0)
Neutrophils: 34 %
Platelets: 184 10*3/uL (ref 150–450)
RBC: 4.92 x10E6/uL (ref 3.77–5.28)
RDW: 16.1 % — ABNORMAL HIGH (ref 12.3–15.4)
WBC: 5.4 10*3/uL (ref 3.4–10.8)

## 2018-01-10 LAB — RPR: RPR Ser Ql: NONREACTIVE

## 2018-01-10 LAB — HIV ANTIBODY (ROUTINE TESTING W REFLEX): HIV SCREEN 4TH GENERATION: NONREACTIVE

## 2018-01-10 LAB — TSH: TSH: 0.902 u[IU]/mL (ref 0.450–4.500)

## 2018-01-10 LAB — GC/CHLAMYDIA PROBE AMP
Chlamydia trachomatis, NAA: NEGATIVE
Neisseria gonorrhoeae by PCR: NEGATIVE

## 2018-01-12 ENCOUNTER — Telehealth: Payer: Self-pay | Admitting: Obstetrics and Gynecology

## 2018-01-12 NOTE — Telephone Encounter (Signed)
Patient called to check on the status of her recent test results from Monday, 01/09/18.

## 2018-01-12 NOTE — Telephone Encounter (Signed)
Dr. Oscar LaJertson -please review labs dated 01/09/18 and advise.

## 2018-01-12 NOTE — Telephone Encounter (Signed)
Normal labs, result note routed to you.

## 2018-01-12 NOTE — Telephone Encounter (Signed)
Left detailed message, ok per dpr. Advised or normal results. Return call to office if any additional questions. Encounter closed.

## 2018-03-13 ENCOUNTER — Other Ambulatory Visit: Payer: Self-pay

## 2018-03-13 ENCOUNTER — Telehealth: Payer: Self-pay | Admitting: Obstetrics and Gynecology

## 2018-03-13 ENCOUNTER — Encounter: Payer: Self-pay | Admitting: Obstetrics and Gynecology

## 2018-03-13 ENCOUNTER — Ambulatory Visit (INDEPENDENT_AMBULATORY_CARE_PROVIDER_SITE_OTHER): Payer: 59 | Admitting: Obstetrics and Gynecology

## 2018-03-13 VITALS — BP 122/72 | HR 84 | Wt 139.0 lb

## 2018-03-13 DIAGNOSIS — R102 Pelvic and perineal pain: Secondary | ICD-10-CM | POA: Diagnosis not present

## 2018-03-13 DIAGNOSIS — N3090 Cystitis, unspecified without hematuria: Secondary | ICD-10-CM

## 2018-03-13 DIAGNOSIS — N920 Excessive and frequent menstruation with regular cycle: Secondary | ICD-10-CM | POA: Diagnosis not present

## 2018-03-13 DIAGNOSIS — N898 Other specified noninflammatory disorders of vagina: Secondary | ICD-10-CM | POA: Diagnosis not present

## 2018-03-13 LAB — POCT URINALYSIS DIPSTICK
BILIRUBIN UA: NEGATIVE
Blood, UA: POSITIVE
GLUCOSE UA: NEGATIVE
Ketones, UA: NEGATIVE
Nitrite, UA: NEGATIVE
Protein, UA: NEGATIVE
Spec Grav, UA: 1.01 (ref 1.010–1.025)
Urobilinogen, UA: 0.2 E.U./dL
pH, UA: 5 (ref 5.0–8.0)

## 2018-03-13 MED ORDER — PHENAZOPYRIDINE HCL 200 MG PO TABS: 200.0000 mg | ORAL_TABLET | Freq: Three times a day (TID) | ORAL | 0 refills | Status: DC | PRN

## 2018-03-13 MED ORDER — NITROFURANTOIN MONOHYD MACRO 100 MG PO CAPS: 100.0000 mg | ORAL_CAPSULE | Freq: Two times a day (BID) | ORAL | 0 refills | Status: DC

## 2018-03-13 NOTE — Progress Notes (Signed)
GYNECOLOGY  VISIT   HPI: 22 y.o.   Single Black or African American Not Hispanic or Latino  female   G1P0010 with Patient's last menstrual period was 02/20/2018 (exact date).   here for UTI symptoms and vaginal discharge.  She c/o a 1 week h/o dysuria, frequency, and urgency. Symptoms got much worse over the weekend. She just had some spotting yesterday and the day before, pink. She also c/o some increase in white vaginal d/c in the last week. No itching, burning or irritation. Cycles have been every 28 days, LMP 02/20/18 (normal). Slight pelvic cramping constantly since yesterday, 2/10 in severity. She is with the same partner x 1.5 years, live together. Negative STD testing in 9/19.  Prior pelvic pain is gone.   GYNECOLOGIC HISTORY: Patient's last menstrual period was 02/20/2018 (exact date). Contraception:None Menopausal hormone therapy: None        OB History    Gravida  1   Para      Term      Preterm      AB  1   Living        SAB  1   TAB      Ectopic      Multiple      Live Births                 There are no active problems to display for this patient.   Past Medical History:  Diagnosis Date  . Chlamydia 2015  . HSV-1 (herpes simplex virus 1) infection   . STD (sexually transmitted disease)     Past Surgical History:  Procedure Laterality Date  . nexplanon removed     removed 05-02-17    No current outpatient medications on file.   No current facility-administered medications for this visit.      ALLERGIES: Penicillins and Sulfa antibiotics  History reviewed. No pertinent family history.  Social History   Socioeconomic History  . Marital status: Single    Spouse name: Not on file  . Number of children: Not on file  . Years of education: Not on file  . Highest education level: Not on file  Occupational History  . Not on file  Social Needs  . Financial resource strain: Not on file  . Food insecurity:    Worry: Not on file   Inability: Not on file  . Transportation needs:    Medical: Not on file    Non-medical: Not on file  Tobacco Use  . Smoking status: Never Smoker  . Smokeless tobacco: Never Used  Substance and Sexual Activity  . Alcohol use: Yes    Alcohol/week: 3.0 standard drinks    Types: 3 Standard drinks or equivalent per week    Frequency: Never  . Drug use: No  . Sexual activity: Yes    Partners: Male    Birth control/protection: None  Lifestyle  . Physical activity:    Days per week: Not on file    Minutes per session: Not on file  . Stress: Not on file  Relationships  . Social connections:    Talks on phone: Not on file    Gets together: Not on file    Attends religious service: Not on file    Active member of club or organization: Not on file    Attends meetings of clubs or organizations: Not on file    Relationship status: Not on file  . Intimate partner violence:    Fear of current  or ex partner: Not on file    Emotionally abused: Not on file    Physically abused: Not on file    Forced sexual activity: Not on file  Other Topics Concern  . Not on file  Social History Narrative  . Not on file    Review of Systems  Constitutional: Negative.   HENT: Negative.   Eyes: Negative.   Respiratory: Negative.   Cardiovascular: Negative.   Gastrointestinal: Negative.   Genitourinary: Positive for dysuria, frequency and urgency.       Vaginal discharge Vaginal spotting  Musculoskeletal: Negative.   Skin: Negative.   Neurological: Negative.   Endo/Heme/Allergies: Negative.   Psychiatric/Behavioral: Negative.     PHYSICAL EXAMINATION:    BP 122/72 (BP Location: Right Arm, Patient Position: Sitting, Cuff Size: Normal)   Pulse 84   Wt 139 lb (63 kg)   LMP 02/20/2018 (Exact Date)   BMI 26.05 kg/m     General appearance: alert, cooperative and appears stated age CVA: not tender Abdomen: soft, non-tender; non distended, no masses,  no organomegaly  Pelvic: External  genitalia:  no lesions              Urethra:  normal appearing urethra with no masses, tenderness or lesions              Bartholins and Skenes: normal                 Vagina: normal appearing vagina with an increase in thick, white vaginal discharge              Cervix: no cervical motion tenderness and no lesions, not friable              Bimanual Exam:  Uterus:  normal size, contour, position, consistency, mobility, non-tender              Adnexa: no mass, fullness, tenderness                Chaperone was present for exam.  Urine dip: 2+ leuk, +blood  ASSESSMENT Cystitis Spotting Pelvic cramping, since yesterday, mild. Normal exam Vaginal d/c    PLAN Send urine for ua, c&s UPT negative Calendar bleeding, let me know if her abnormal bleeding persists Affirm   An After Visit Summary was printed and given to the patient.

## 2018-03-13 NOTE — Telephone Encounter (Signed)
Patient thinks she may have a uit and discharge. Patient would like an appointment today. To triage to assist with scheduling.

## 2018-03-13 NOTE — Patient Instructions (Signed)

## 2018-03-13 NOTE — Telephone Encounter (Signed)
Pt returned call. Dysuria and vaginal discharge, pink/blood tinged discharge.  Office visit today with Dr. Oscar La scheduled.  Encounter closed.

## 2018-03-14 ENCOUNTER — Telehealth: Payer: Self-pay

## 2018-03-14 LAB — URINALYSIS, MICROSCOPIC ONLY
Casts: NONE SEEN /lpf
WBC, UA: >30 /hpf — AB (ref 0–5)

## 2018-03-14 LAB — VAGINITIS/VAGINOSIS, DNA PROBE
Candida Species: NEGATIVE
GARDNERELLA VAGINALIS: POSITIVE — AB
Trichomonas vaginosis: NEGATIVE

## 2018-03-14 MED ORDER — METRONIDAZOLE 500 MG PO TABS: 500.0000 mg | ORAL_TABLET | Freq: Two times a day (BID) | ORAL | 0 refills | Status: DC

## 2018-03-14 NOTE — Telephone Encounter (Signed)
-----   Message from Romualdo BolkJill Evelyn Jertson, MD sent at 03/14/2018 11:51 AM EST ----- Please inform the patient that her vaginitis probe was + for BV and treat with flagyl (either oral or vaginal, her choice), no ETOH while on Flagyl.  Oral: Flagyl 500 mg BID x 7 days, or Vaginal: Metrogel, 1 applicator per vagina q day x 5 days. She is on medication for a UTI, culture is pending.

## 2018-03-14 NOTE — Telephone Encounter (Signed)
Notes recorded by Virgel Haro, Caroleen Hamman, RN on 03/14/2018 at 1:00 PM EST Spoke with patient. Results given. Patient verbalizes understanding. Patient would like to be on Flagyl. Rx for Flagyl 500 mg BID x 7 days sent to the pharmacy on file. Avoid alcohol during treatment and 24 hours after completing medication. Don't mix with alcohol if mixed can cause severe nausea, vomiting and abdominal cramping. Patient verbalizes understanding. ------  Notes recorded by Romualdo Bolk, MD on 03/14/2018 at 11:51 AM EST Please inform the patient that her vaginitis probe was + for BV and treat with flagyl (either oral or vaginal, her choice), no ETOH while on Flagyl.  Oral: Flagyl 500 mg BID x 7 days, or Vaginal: Metrogel, 1 applicator per vagina q day x 5 days. She is on medication for a UTI, culture is pending.

## 2018-03-15 ENCOUNTER — Encounter (HOSPITAL_COMMUNITY): Payer: Self-pay | Admitting: Emergency Medicine

## 2018-03-15 ENCOUNTER — Telehealth: Payer: Self-pay | Admitting: Obstetrics and Gynecology

## 2018-03-15 ENCOUNTER — Emergency Department (HOSPITAL_COMMUNITY)
Admission: EM | Admit: 2018-03-15 | Discharge: 2018-03-15 | Disposition: A | Discharge status: Home / Self Care | Payer: 59 | Attending: Emergency Medicine | Admitting: Emergency Medicine

## 2018-03-15 DIAGNOSIS — N3000 Acute cystitis without hematuria: Secondary | ICD-10-CM | POA: Diagnosis not present

## 2018-03-15 DIAGNOSIS — R1084 Generalized abdominal pain: Secondary | ICD-10-CM | POA: Diagnosis present

## 2018-03-15 LAB — COMPREHENSIVE METABOLIC PANEL
ALBUMIN: 4.8 g/dL (ref 3.5–5.0)
ALT: 17 U/L (ref 0–44)
ANION GAP: 10 (ref 5–15)
AST: 20 U/L (ref 15–41)
Alkaline Phosphatase: 41 U/L (ref 38–126)
BUN: 7 mg/dL (ref 6–20)
CHLORIDE: 100 mmol/L (ref 98–111)
CO2: 24 mmol/L (ref 22–32)
Calcium: 9.2 mg/dL (ref 8.9–10.3)
Creatinine, Ser: 0.88 mg/dL (ref 0.44–1.00)
GFR calc Af Amer: >60 mL/min (ref >60)
GFR calc non Af Amer: >60 mL/min (ref >60)
GLUCOSE: 104 mg/dL — AB (ref 70–99)
POTASSIUM: 3.6 mmol/L (ref 3.5–5.1)
SODIUM: 134 mmol/L — AB (ref 135–145)
Total Bilirubin: 0.7 mg/dL (ref 0.3–1.2)
Total Protein: 8.5 g/dL — ABNORMAL HIGH (ref 6.5–8.1)

## 2018-03-15 LAB — CBC WITH DIFFERENTIAL/PLATELET
ABS IMMATURE GRANULOCYTES: 0.01 10*3/uL (ref 0.00–0.07)
BASOS PCT: 0 %
Basophils Absolute: 0 10*3/uL (ref 0.0–0.1)
EOS ABS: 0 10*3/uL (ref 0.0–0.5)
Eosinophils Relative: 0 %
HCT: 35.8 % — ABNORMAL LOW (ref 36.0–46.0)
HEMOGLOBIN: 11.7 g/dL — AB (ref 12.0–15.0)
Immature Granulocytes: 0 %
Lymphocytes Relative: 16 %
Lymphs Abs: 1.7 10*3/uL (ref 0.7–4.0)
MCH: 23 pg — ABNORMAL LOW (ref 26.0–34.0)
MCHC: 32.7 g/dL (ref 30.0–36.0)
MCV: 70.5 fL — ABNORMAL LOW (ref 80.0–100.0)
MONO ABS: 1 10*3/uL (ref 0.1–1.0)
MONOS PCT: 10 %
NEUTROS PCT: 74 %
Neutro Abs: 7.6 10*3/uL (ref 1.7–7.7)
PLATELETS: 188 10*3/uL (ref 150–400)
RBC: 5.08 MIL/uL (ref 3.87–5.11)
RDW: 13.7 % (ref 11.5–15.5)
WBC: 10.3 10*3/uL (ref 4.0–10.5)
nRBC: 0 % (ref 0.0–0.2)

## 2018-03-15 LAB — I-STAT BETA HCG BLOOD, ED (MC, WL, AP ONLY)

## 2018-03-15 LAB — URINE CULTURE

## 2018-03-15 LAB — LIPASE, BLOOD: Lipase: 18 U/L (ref 11–51)

## 2018-03-15 MED ORDER — ONDANSETRON 4 MG PO TBDP: ORAL_TABLET | ORAL | 0 refills | Status: DC

## 2018-03-15 MED ORDER — SODIUM CHLORIDE 0.9 % IV BOLUS
500.0000 mL | Freq: Once | INTRAVENOUS | Status: AC
  Administered 2018-03-15: 500 mL via INTRAVENOUS

## 2018-03-15 MED ORDER — CIPROFLOXACIN HCL 500 MG PO TABS: 500.0000 mg | ORAL_TABLET | Freq: Two times a day (BID) | ORAL | 0 refills | Status: DC

## 2018-03-15 MED ORDER — IBUPROFEN 800 MG PO TABS: 800.0000 mg | ORAL_TABLET | Freq: Three times a day (TID) | ORAL | 0 refills | Status: DC | PRN

## 2018-03-15 MED ORDER — CIPROFLOXACIN IN D5W 400 MG/200ML IV SOLN
400.0000 mg | Freq: Once | INTRAVENOUS | Status: AC
  Administered 2018-03-15: 400 mg via INTRAVENOUS
  Filled 2018-03-15: qty 200

## 2018-03-15 NOTE — ED Provider Notes (Signed)
[facility]-EMERGENCY DEPT Provider Note   CSN: [id number] Arrival date & time: 03/15/18  1023     History   Chief Complaint Chief Complaint  Patient presents with  . Back Pain  . Flank Pain  . Nausea    HPI Julie Whitaker is a 22 y.o. female.  Patient complains of flank pain.  She was recently diagnosed with urinary tract infection and placed on Macrodantin for the last couple days which she states her pain is getting worse and has moved her back  The history is provided by the patient. No language interpreter was used.  Back Pain   This is a new problem. The current episode started 12 to 24 hours ago. The problem occurs constantly. The problem has not changed since onset.The pain is associated with no known injury. Pain location: Left flank. The quality of the pain is described as aching. The pain does not radiate. The pain is at a severity of 6/10. The pain is moderate. Exacerbated by: Nothing. The pain is the same all the time. Pertinent negatives include no chest pain, no headaches and no abdominal pain. Treatments tried: Macrodantin. The treatment provided no relief. Risk factors: Unknown.    Past Medical History:  Diagnosis Date  . Chlamydia 2015  . HSV-1 (herpes simplex virus 1) infection   . STD (sexually transmitted disease)     There are no active problems to display for this patient.   Past Surgical History:  Procedure Laterality Date  . nexplanon removed     removed 05-02-17     OB History    Gravida  1   Para      Term      Preterm      AB  1   Living        SAB  1   TAB      Ectopic      Multiple      Live Births               Home Medications    Prior to Admission medications   Medication Sig Start Date End Date Taking? Authorizing Provider  metroNIDAZOLE (FLAGYL) 500 MG tablet Take 1 tablet (500 mg total) by mouth 2 (two) times daily. 03/14/18  Yes Romualdo Bolk, MD  nitrofurantoin,  macrocrystal-monohydrate, (MACROBID) 100 MG capsule Take 1 capsule (100 mg total) by mouth 2 (two) times daily. 03/13/18  Yes Romualdo Bolk, MD  phenazopyridine (PYRIDIUM) 200 MG tablet Take 1 tablet (200 mg total) by mouth 3 (three) times daily as needed. 03/13/18  Yes Romualdo Bolk, MD  ciprofloxacin (CIPRO) 500 MG tablet Take 1 tablet (500 mg total) by mouth 2 (two) times daily. 03/15/18   Bethann Berkshire, MD  ibuprofen (ADVIL,MOTRIN) 800 MG tablet Take 1 tablet (800 mg total) by mouth every 8 (eight) hours as needed. 03/15/18   Bethann Berkshire, MD  ondansetron (ZOFRAN ODT) 4 MG disintegrating tablet 4mg  ODT q4 hours prn nausea/vomit 03/15/18   Bethann Berkshire, MD    Family History No family history on file.  Social History Social History   Tobacco Use  . Smoking status: Never Smoker  . Smokeless tobacco: Never Used  Substance Use Topics  . Alcohol use: Yes    Alcohol/week: 3.0 standard drinks    Types: 3 Standard drinks or equivalent per week    Frequency: Never  . Drug use: No     Allergies   Penicillins and Sulfa antibiotics  Review of Systems Review of Systems  Constitutional: Negative for appetite change and fatigue.  HENT: Negative for congestion, ear discharge and sinus pressure.   Eyes: Negative for discharge.  Respiratory: Negative for cough.   Cardiovascular: Negative for chest pain.  Gastrointestinal: Negative for abdominal pain and diarrhea.  Genitourinary: Negative for frequency and hematuria.  Musculoskeletal: Positive for back pain.  Skin: Negative for rash.  Neurological: Negative for seizures and headaches.  Psychiatric/Behavioral: Negative for hallucinations.     Physical Exam Updated Vital Signs BP (!) 126/99   Pulse (!) 115   Temp 99.6 F (37.6 C)   Resp 14   Ht 5' 1.5" (1.562 m)   Wt 63 kg   LMP 02/20/2018 (Exact Date)   SpO2 99%   BMI 25.84 kg/m   Physical Exam  Constitutional: She is oriented to person, place, and time.  She appears well-developed.  HENT:  Head: Normocephalic.  Eyes: Conjunctivae and EOM are normal. No scleral icterus.  Neck: Neck supple. No thyromegaly present.  Cardiovascular: Normal rate and regular rhythm. Exam reveals no gallop and no friction rub.  No murmur heard. Pulmonary/Chest: No stridor. She has no wheezes. She has no rales. She exhibits no tenderness.  Abdominal: She exhibits no distension. There is no tenderness. There is no rebound.  Genitourinary:  Genitourinary Comments: Tender left flank  Musculoskeletal: Normal range of motion. She exhibits no edema.  Lymphadenopathy:    She has no cervical adenopathy.  Neurological: She is oriented to person, place, and time. She exhibits normal muscle tone. Coordination normal.  Skin: No rash noted. No erythema.  Psychiatric: She has a normal mood and affect. Her behavior is normal.     ED Treatments / Results  Labs (all labs ordered are listed, but only abnormal results are displayed) Labs Reviewed  CBC WITH DIFFERENTIAL/PLATELET - Abnormal; Notable for the following components:      Result Value   Hemoglobin 11.7 (*)    HCT 35.8 (*)    MCV 70.5 (*)    MCH 23.0 (*)    All other components within normal limits  COMPREHENSIVE METABOLIC PANEL - Abnormal; Notable for the following components:   Sodium 134 (*)    Glucose, Bld 104 (*)    Total Protein 8.5 (*)    All other components within normal limits  LIPASE, BLOOD  I-STAT BETA HCG BLOOD, ED ([facility], [facility], [facility] ONLY)    EKG None  Radiology No results found.  Procedures Procedures (including critical care time)  Medications Ordered in ED Medications  sodium chloride 0.9 % bolus 500 mL (500 mLs Intravenous New Bag/Given 03/15/18 1150)  ciprofloxacin (CIPRO) IVPB 400 mg (400 mg Intravenous New Bag/Given 03/15/18 1150)  Blood work unremarkable.  Urine culture done in the office shows E. coli greater than 100,000 colonies.  Since patient symptoms are getting worse we will  change her antibiotic from Macrodantin to Cipro she is also given some Motrin and some Zofran and will follow-up next week   Initial Impression / Assessment and Plan / ED Course  I have reviewed the triage vital signs and the nursing notes.  Pertinent labs & imaging results that were available during my care of the patient were reviewed by me and considered in my medical decision making   Final Clinical Impressions(s) / ED Diagnoses   Final diagnoses:  Acute cystitis without hematuria    ED Discharge Orders         Ordered    ciprofloxacin (CIPRO) 500  MG tablet  2 times daily     03/15/18 1242    ibuprofen (ADVIL,MOTRIN) 800 MG tablet  Every 8 hours PRN     03/15/18 1242    ondansetron (ZOFRAN ODT) 4 MG disintegrating tablet     03/15/18 1242           Bethann BerkshireZammit, Emmah Bratcher, MD 03/15/18 1245

## 2018-03-15 NOTE — ED Triage Notes (Signed)
Patient here from home with complaints of back pain x1 week. Reports that Monday she was diagnosed with UTI and on Tues diagnosed with bac. Vaginosis. Bilateral flank pain started yesterday. Nausea, no vomiting.

## 2018-03-15 NOTE — Discharge Instructions (Addendum)
Stop taking the Macrodantin.  Drink plenty of fluids.  Take the new antibiotic.  Follow-up with your doctor next week for recheck

## 2018-03-15 NOTE — Telephone Encounter (Signed)
Patient is asking to talk with Dr.Jerton's nurse regarding some symptoms that she is having.

## 2018-03-15 NOTE — Telephone Encounter (Signed)
Left message to call Zissel Biederman, RN at [facility] [phone].   

## 2018-03-16 NOTE — Telephone Encounter (Signed)
Spoke with patient. Patient was seen in the ER yesterday for worsening UTI symptoms. Was switched to Cipro 500 mg po BID. Reports she is feeling a little better, but is still having some symptoms. Denies any fever or chills. Reports having ongoing flank pain, dysuria, urgency, and frequency. States since med change symptoms have lessened. Patient would like to come in to be seen in our office again for follow up and evaluation. Requesting an appointment tomorrow. Appointment scheduled for 10:30 am with Leota Sauerseborah Leonard CNM. Patient is agreeable to date, time, and to see another provider.  Routing to covering provider and will close encounter.

## 2018-03-16 NOTE — Telephone Encounter (Signed)
Patient is asking to talk with Julie Whitaker again. No open phone note?

## 2018-03-16 NOTE — Telephone Encounter (Signed)
Left message to call Shantel Wesely, RN at [facility] [phone].   

## 2018-03-17 ENCOUNTER — Encounter: Payer: Self-pay | Admitting: Certified Nurse Midwife

## 2018-03-17 ENCOUNTER — Ambulatory Visit (INDEPENDENT_AMBULATORY_CARE_PROVIDER_SITE_OTHER): Payer: 59 | Admitting: Certified Nurse Midwife

## 2018-03-17 ENCOUNTER — Other Ambulatory Visit: Payer: Self-pay

## 2018-03-17 VITALS — BP 108/64 | HR 68 | Temp 98.7°F | Resp 16 | Wt 137.0 lb

## 2018-03-17 DIAGNOSIS — B9689 Other specified bacterial agents as the cause of diseases classified elsewhere: Secondary | ICD-10-CM

## 2018-03-17 DIAGNOSIS — N760 Acute vaginitis: Secondary | ICD-10-CM

## 2018-03-17 DIAGNOSIS — N390 Urinary tract infection, site not specified: Secondary | ICD-10-CM

## 2018-03-17 DIAGNOSIS — Z01419 Encounter for gynecological examination (general) (routine) without abnormal findings: Secondary | ICD-10-CM

## 2018-03-17 NOTE — Patient Instructions (Signed)

## 2018-03-17 NOTE — Progress Notes (Signed)
22 y.o. Single African American female G1P0010 here with complaint of UTI, with onset  on 03/13/18 and treated with Macrobid, culture showed E.Coli  Seen in ER, culture noted and medication changed to Cipro on 03/15/18 due to sensitivity. Still complaining of urinary symptoms. She also was treated for BV with Flagyl. Patient having nausea with use and was given Zofran with some relief. Patient had previously complaint of urinary frequency/urgency/ and pain with urination with some back pain.  Symptoms are better, but feels very fatigued and eating small amounts. Patient denies fever or chills.Nausea better with Zofran use so she can continue medication.. Continues with slight back pain.Vaginal symptoms have improved with Flagyl and trying to complete medication.   Contraception is none HCG done at ER and was negative.. Patient is trying to drink adequate water intake. Has only had 2 bottles of water today and crackers, some fruit. Has never had UTI and did not realize how ill you can feel with this. 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 No other issues today.  Review of Systems  Constitutional: Positive for malaise/fatigue.  HENT: Negative.   Eyes: Negative.   Respiratory: Negative.   Cardiovascular: Negative.   Gastrointestinal: Positive for vomiting.       With Flagyl  Genitourinary: Positive for dysuria, flank pain and frequency.  Musculoskeletal: Positive for back pain.       Slight  Skin: Negative.   Neurological:       Tired feeling  Endo/Heme/Allergies: Negative.   Psychiatric/Behavioral: Negative.     O: Healthy female WDWN Affect: Normal, orientation x 3 Skin : warm and dry CVAT: negative bilateral Abdomen: positive for suprapubic tenderness  Pelvic exam: External genital area: normal, no lesions Bladder,Urethra tender, Urethral meatus: tender, slightly red Vagina: thin vaginal discharge, slight odor  Cervix: normal, non  tender Uterus:normal,non tender Adnexa: normal non tender, no fullness or masses   A: UTI under treatment BV under treatment Nausea/vomiting has Rx Zofran with decrease Normal pelvic exam  P: Reviewed findings of UTI still present and need for continued treatment. Medication is treating the E.coli UTI. Discussed expectations of fatigue, urinary frequency and can have nausea or cramping during treatment.Continue medication as prescribed. Discussed warning signs with UTI and need to advise. If unable to complete medication needs to call. Discussed feel she should not do extensive physical activity at this time due to infection. Note will be given. Encouraged to limit soda, tea, and coffee and be sure to increase water intake. Complete Flagyl as prescribed. Discussed vaginal discharge expectations. Questions addressed at length.   RV prn

## 2018-03-22 ENCOUNTER — Telehealth: Payer: Self-pay | Admitting: Obstetrics and Gynecology

## 2018-03-22 MED ORDER — METRONIDAZOLE 0.75 % VA GEL: VAGINAL | 0 refills | Status: DC

## 2018-03-22 NOTE — Telephone Encounter (Signed)
Patient is calling and requesting a change in her medication. Patient stated that the medication is making her nauseas and would like to change.

## 2018-03-22 NOTE — Telephone Encounter (Signed)
Spoke with patient. She has been unable to tolerate PO flagyl due to nausea. Had stopped taking flagyl to start cipro for Ecoli UTI and has restarted flagyl but just cannot tolerate the nausea. Has tried eating with it but cannot tolerate.  Metrogel ordered as previously written by Dr. Oscar LaJertson.  Advised patient to call back if any problems with Metrogel. Will need office visit for follow up.   Routing to Dr. Oscar LaJertson as Rx changed from oral to vaginal treatment and will close encounter.

## 2018-04-12 NOTE — Telephone Encounter (Signed)
OK to close or is further follow up necessary? °

## 2018-05-22 ENCOUNTER — Encounter (HOSPITAL_COMMUNITY): Payer: Self-pay | Admitting: *Deleted

## 2018-05-22 ENCOUNTER — Inpatient Hospital Stay (HOSPITAL_COMMUNITY): Payer: 59

## 2018-05-22 ENCOUNTER — Inpatient Hospital Stay (HOSPITAL_COMMUNITY)
Admission: AD | Admit: 2018-05-22 | Discharge: 2018-05-22 | Disposition: A | Discharge status: Home / Self Care | Payer: 59 | Attending: Obstetrics and Gynecology | Admitting: Obstetrics and Gynecology

## 2018-05-22 ENCOUNTER — Other Ambulatory Visit: Payer: Self-pay

## 2018-05-22 ENCOUNTER — Telehealth: Payer: Self-pay | Admitting: Certified Nurse Midwife

## 2018-05-22 DIAGNOSIS — Z880 Allergy status to penicillin: Secondary | ICD-10-CM | POA: Diagnosis not present

## 2018-05-22 DIAGNOSIS — O3680X0 Pregnancy with inconclusive fetal viability, not applicable or unspecified: Secondary | ICD-10-CM

## 2018-05-22 DIAGNOSIS — O26899 Other specified pregnancy related conditions, unspecified trimester: Secondary | ICD-10-CM

## 2018-05-22 DIAGNOSIS — R109 Unspecified abdominal pain: Secondary | ICD-10-CM | POA: Diagnosis not present

## 2018-05-22 DIAGNOSIS — Z3A01 Less than 8 weeks gestation of pregnancy: Secondary | ICD-10-CM | POA: Insufficient documentation

## 2018-05-22 DIAGNOSIS — R1030 Lower abdominal pain, unspecified: Secondary | ICD-10-CM | POA: Diagnosis present

## 2018-05-22 DIAGNOSIS — O26891 Other specified pregnancy related conditions, first trimester: Secondary | ICD-10-CM | POA: Insufficient documentation

## 2018-05-22 DIAGNOSIS — Z6790 Unspecified blood type, Rh positive: Secondary | ICD-10-CM

## 2018-05-22 LAB — URINALYSIS, ROUTINE W REFLEX MICROSCOPIC
Bilirubin Urine: NEGATIVE
GLUCOSE, UA: NEGATIVE mg/dL
HGB URINE DIPSTICK: NEGATIVE
Ketones, ur: NEGATIVE mg/dL
Leukocytes, UA: NEGATIVE
NITRITE: NEGATIVE
PH: 6 (ref 5.0–8.0)
Protein, ur: NEGATIVE mg/dL
Specific Gravity, Urine: 1.016 (ref 1.005–1.030)

## 2018-05-22 LAB — WET PREP, GENITAL
CLUE CELLS WET PREP: NONE SEEN
SPERM: NONE SEEN
Trich, Wet Prep: NONE SEEN
Yeast Wet Prep HPF POC: NONE SEEN

## 2018-05-22 LAB — CBC
HEMATOCRIT: 32.7 % — AB (ref 36.0–46.0)
HEMOGLOBIN: 11 g/dL — AB (ref 12.0–15.0)
MCH: 23.2 pg — ABNORMAL LOW (ref 26.0–34.0)
MCHC: 33.6 g/dL (ref 30.0–36.0)
MCV: 68.8 fL — AB (ref 80.0–100.0)
Platelets: 221 10*3/uL (ref 150–400)
RBC: 4.75 MIL/uL (ref 3.87–5.11)
RDW: 14.6 % (ref 11.5–15.5)
WBC: 7.1 10*3/uL (ref 4.0–10.5)
nRBC: 0 % (ref 0.0–0.2)

## 2018-05-22 LAB — ABO/RH: ABO/RH(D): O POS

## 2018-05-22 LAB — POCT PREGNANCY, URINE: Preg Test, Ur: POSITIVE — AB

## 2018-05-22 LAB — HCG, QUANTITATIVE, PREGNANCY: HCG, BETA CHAIN, QUANT, S: 772 m[IU]/mL — AB (ref <5)

## 2018-05-22 NOTE — Progress Notes (Deleted)
GYNECOLOGY  VISIT   HPI: 23 y.o.   Single Black or African American Not Hispanic or Latino  female   G1P0010 with No LMP recorded.   here for     GYNECOLOGIC HISTORY: No LMP recorded. Contraception:*** Menopausal hormone therapy: ***        OB History    Gravida  1   Para      Term      Preterm      AB  1   Living        SAB  1   TAB      Ectopic      Multiple      Live Births                 There are no active problems to display for this patient.   Past Medical History:  Diagnosis Date  . Chlamydia 2015  . HSV-1 (herpes simplex virus 1) infection   . STD (sexually transmitted disease)     Past Surgical History:  Procedure Laterality Date  . nexplanon removed     removed 05-02-17    Current Outpatient Medications  Medication Sig Dispense Refill  . ciprofloxacin (CIPRO) 500 MG tablet Take 1 tablet (500 mg total) by mouth 2 (two) times daily. 14 tablet 0  . ibuprofen (ADVIL,MOTRIN) 800 MG tablet Take 1 tablet (800 mg total) by mouth every 8 (eight) hours as needed. 15 tablet 0  . metroNIDAZOLE (METROGEL) 0.75 % vaginal gel Place one applicator full PV at hs for 5 nights. 70 g 0  . ondansetron (ZOFRAN ODT) 4 MG disintegrating tablet 4mg  ODT q4 hours prn nausea/vomit 10 tablet 0   No current facility-administered medications for this visit.      ALLERGIES: Penicillins and Sulfa antibiotics  No family history on file.  Social History   Socioeconomic History  . Marital status: Single    Spouse name: Not on file  . Number of children: Not on file  . Years of education: Not on file  . Highest education level: Not on file  Occupational History  . Not on file  Social Needs  . Financial resource strain: Not on file  . Food insecurity:    Worry: Not on file    Inability: Not on file  . Transportation needs:    Medical: Not on file    Non-medical: Not on file  Tobacco Use  . Smoking status: Never Smoker  . Smokeless tobacco: Never Used   Substance and Sexual Activity  . Alcohol use: Not Currently    Frequency: Never  . Drug use: No  . Sexual activity: Yes    Partners: Male    Birth control/protection: None  Lifestyle  . Physical activity:    Days per week: Not on file    Minutes per session: Not on file  . Stress: Not on file  Relationships  . Social connections:    Talks on phone: Not on file    Gets together: Not on file    Attends religious service: Not on file    Active member of club or organization: Not on file    Attends meetings of clubs or organizations: Not on file    Relationship status: Not on file  . Intimate partner violence:    Fear of current or ex partner: Not on file    Emotionally abused: Not on file    Physically abused: Not on file    Forced sexual activity: Not  on file  Other Topics Concern  . Not on file  Social History Narrative  . Not on file    ROS  PHYSICAL EXAMINATION:    There were no vitals taken for this visit.    General appearance: alert, cooperative and appears stated age Neck: no adenopathy, supple, symmetrical, trachea midline and thyroid {CHL AMB PHY EX THYROID NORM DEFAULT:[phone]::"normal to inspection and palpation"} Breasts: {Exam; breast:13139::"normal appearance, no masses or tenderness"} Abdomen: soft, non-tender; non distended, no masses,  no organomegaly  Pelvic: External genitalia:  no lesions              Urethra:  normal appearing urethra with no masses, tenderness or lesions              Bartholins and Skenes: normal                 Vagina: normal appearing vagina with normal color and discharge, no lesions              Cervix: {CHL AMB PHY EX CERVIX NORM [phone]::"no lesions"}              Bimanual Exam:  Uterus:  {CHL AMB PHY EX UTERUS NORM DEFAULT:[id number]::"normal size, contour, position, consistency, mobility, non-tender"}              Adnexa: {CHL AMB PHY EX ADNEXA NO MASS DEFAULT:[phone]::"no mass, fullness, tenderness"}               Rectovaginal: {yes no:[id number]}.  Confirms.              Anus:  normal sphincter tone, no lesions  Chaperone was present for exam.  ASSESSMENT     PLAN    An After Visit Summary was printed and given to the patient.  *** minutes face to face time of which over 50% was spent in counseling.

## 2018-05-22 NOTE — MAU Note (Signed)
Having cramping.  Just got a +preg test on Sat.

## 2018-05-22 NOTE — MAU Provider Note (Signed)
History     CSN: [id number]  Arrival date and time: 05/22/18 1541   First Provider Initiated Contact with Patient 05/22/18 1932      Chief Complaint  Patient presents with  . Abdominal Pain  . Possible Pregnancy   [id number] @[redacted]w[redacted]d  presenting with low abd cramping. Sx started 1 week ago. Denies urinary sx. Having nausea but no vomiting. Denies VB and discharge. No fevers.    OB History    Gravida  2   Para      Term      Preterm      AB  1   Living        SAB  1   TAB      Ectopic      Multiple      Live Births              Past Medical History:  Diagnosis Date  . Chlamydia 2015  . HSV-1 (herpes simplex virus 1) infection   . STD (sexually transmitted disease)     Past Surgical History:  Procedure Laterality Date  . nexplanon removed     removed 05-02-17    No family history on file.  Social History   Tobacco Use  . Smoking status: Never Smoker  . Smokeless tobacco: Never Used  Substance Use Topics  . Alcohol use: Not Currently    Frequency: Never  . Drug use: No    Allergies:  Allergies  Allergen Reactions  . Penicillins Anaphylaxis    Has patient had a PCN reaction causing immediate rash, facial/tongue/throat swelling, SOB or lightheadedness with hypotension: yes Has patient had a PCN reaction causing severe rash involving mucus membranes or skin necrosis: yes Has patient had a PCN reaction that required hospitalization: unknown Has patient had a PCN reaction occurring within the last 10 years: no If all of the above answers are "NO", then may proceed with Cephalosporin use.   . Sulfa Antibiotics Anaphylaxis and Hives    Medications Prior to Admission  Medication Sig Dispense Refill Last Dose  . Prenatal Vit-Fe Fumarate-FA (MULTIVITAMIN-PRENATAL) 27-0.8 MG TABS tablet Take 1 tablet by mouth daily at 12 noon.   05/22/2018 at Unknown time  . ciprofloxacin (CIPRO) 500 MG tablet Take 1 tablet (500 mg total) by mouth 2 (two) times  daily. 14 tablet 0 Unknown at Unknown time  . ibuprofen (ADVIL,MOTRIN) 800 MG tablet Take 1 tablet (800 mg total) by mouth every 8 (eight) hours as needed. 15 tablet 0 Unknown at Unknown time  . metroNIDAZOLE (METROGEL) 0.75 % vaginal gel Place one applicator full PV at hs for 5 nights. 70 g 0 Unknown at Unknown time  . ondansetron (ZOFRAN ODT) 4 MG disintegrating tablet 4mg  ODT q4 hours prn nausea/vomit 10 tablet 0 Unknown at Unknown time    Review of Systems  Constitutional: Negative for chills and fever.  Gastrointestinal: Positive for abdominal pain and nausea. Negative for constipation, diarrhea and vomiting.  Genitourinary: Negative for dysuria, frequency, hematuria, pelvic pain, vaginal bleeding and vaginal discharge.   Physical Exam   Blood pressure 120/67, pulse 96, temperature 98.8 F (37.1 C), temperature source Oral, resp. rate 16, height 5\' 1"  (1.549 m), weight 63.7 kg, last menstrual period 04/19/2018, SpO2 100 %.  Physical Exam  Constitutional: She is oriented to person, place, and time. She appears well-developed and well-nourished. No distress.  HENT:  Head: Normocephalic and atraumatic.  Neck: Normal range of motion.  Respiratory: Effort normal. No respiratory distress.  GI: Soft. She exhibits no distension and no mass. There is no abdominal tenderness. There is no rebound and no guarding.  Genitourinary:    Genitourinary Comments: External: no lesions or erythema Vagina: rugated, pink, moist, scant thin white discharge Uterus: non enlarged, anteverted, non tender, no CMT Adnexae: no masses, no tenderness left, no tenderness right Cervix closed, nml    Musculoskeletal: Normal range of motion.  Neurological: She is alert and oriented to person, place, and time.  Skin: Skin is warm and dry.  Psychiatric: She has a normal mood and affect.   Results for orders placed or performed during the hospital encounter of 05/22/18 (from the past 24 hour(s))  Urinalysis,  Routine w reflex microscopic     Status: None   Collection Time: 05/22/18  4:26 PM  Result Value Ref Range   Color, Urine YELLOW YELLOW   APPearance CLEAR CLEAR   Specific Gravity, Urine 1.016 1.005 - 1.030   pH 6.0 5.0 - 8.0   Glucose, UA NEGATIVE NEGATIVE mg/dL   Hgb urine dipstick NEGATIVE NEGATIVE   Bilirubin Urine NEGATIVE NEGATIVE   Ketones, ur NEGATIVE NEGATIVE mg/dL   Protein, ur NEGATIVE NEGATIVE mg/dL   Nitrite NEGATIVE NEGATIVE   Leukocytes, UA NEGATIVE NEGATIVE  Pregnancy, urine POC     Status: Abnormal   Collection Time: 05/22/18  4:49 PM  Result Value Ref Range   Preg Test, Ur POSITIVE (A) NEGATIVE  CBC     Status: Abnormal   Collection Time: 05/22/18  7:32 PM  Result Value Ref Range   WBC 7.1 4.0 - 10.5 K/uL   RBC 4.75 3.87 - 5.11 MIL/uL   Hemoglobin 11.0 (L) 12.0 - 15.0 g/dL   HCT 16.1 (L) 09.6 - 04.5 %   MCV 68.8 (L) 80.0 - 100.0 fL   MCH 23.2 (L) 26.0 - 34.0 pg   MCHC 33.6 30.0 - 36.0 g/dL   RDW 40.9 81.1 - 91.4 %   Platelets 221 150 - 400 K/uL   nRBC 0.0 0.0 - 0.2 %  hCG, quantitative, pregnancy     Status: Abnormal   Collection Time: 05/22/18  7:32 PM  Result Value Ref Range   hCG, Beta Chain, Quant, S 772 (H) <5 mIU/mL  ABO/Rh     Status: None (Preliminary result)   Collection Time: 05/22/18  7:32 PM  Result Value Ref Range   ABO/RH(D)      O POS Performed at [facility], [address], [place], [place] [address]   Wet prep, genital     Status: Abnormal   Collection Time: 05/22/18  7:42 PM  Result Value Ref Range   Yeast Wet Prep HPF POC NONE SEEN NONE SEEN   Trich, Wet Prep NONE SEEN NONE SEEN   Clue Cells Wet Prep HPF POC NONE SEEN NONE SEEN   WBC, Wet Prep HPF POC FEW (A) NONE SEEN   Sperm NONE SEEN    US Ob Less Than 14 Weeks With Ob Transvaginal  Result Date: 05/22/2018 CLINICAL DATA:  23 year old female with positive HCG level presenting with pelvic cramping. EXAM: OBSTETRIC <14 WK [place] AND TRANSVAGINAL OB US TECHNIQUE: Both  transabdominal and transvaginal ultrasound examinations were performed for complete evaluation of the gestation as well as the maternal uterus, adnexal regions, and pelvic cul-de-sac. Transvaginal technique was performed to assess early pregnancy. COMPARISON:  None. FINDINGS: The uterus is anteverted. The endometrium is slightly heterogeneous and measures 1.5 cm in thickness. No intrauterine pregnancy identified. Findings represent a pregnancy  of unknown location and differential diagnosis includes an early intrauterine pregnancy, recent spontaneous abortion, or an ectopic pregnancy. Clinical correlation and follow-up with serial HCG levels and ultrasound recommended. The ovaries are unremarkable. The right ovary measures 3.3 x 1.7 x 1.4 cm and the left ovary measures 3.4 x 2.3 x 2.4 cm. Small amount of free fluid within the pelvis. IMPRESSION: No intrauterine pregnancy identified and no adnexal masses seen. Findings consistent with pregnancy of unknown location and differential diagnosis includes an early IUP, recent spontaneous abortion, or an occult ectopic pregnancy. Clinical correlation and follow-up with HCG levels and ultrasound recommended. Electronically Signed   By: Elgie Collard M.D.   On: 05/22/2018 20:55    MAU Course  Procedures Orders Placed This Encounter  Procedures  . Wet prep, genital    Standing Status:   Standing    Number of Occurrences:   1    Order Specific Question:   Patient immune status    Answer:   Normal  . US OB LESS THAN 14 WEEKS WITH OB TRANSVAGINAL    Standing Status:   Standing    Number of Occurrences:   1    Order Specific Question:   Symptom/Reason for Exam    Answer:   Abdominal pain in pregnancy [[id number]]  . Urinalysis, Routine w reflex microscopic    Standing Status:   Standing    Number of Occurrences:   1  . CBC    Standing Status:   Standing    Number of Occurrences:   1  . hCG, quantitative, pregnancy    Standing Status:   Standing    Number of  Occurrences:   1  . Pregnancy, urine POC    Standing Status:   Standing    Number of Occurrences:   1  . ABO/Rh    Standing Status:   Standing    Number of Occurrences:   1  . Discharge patient    Order Specific Question:   Discharge disposition    Answer:   01-Home or Self Care [1]    Order Specific Question:   Discharge patient date    Answer:   05/22/2018   MDM Labs and [place] ordered and reviewed. No IUP or adnexal mass seen on [place], findings could indicate early pregnancy, ectopic pregnancy, or failed pregnancy-discussed with pt. Will follow quant in 48 hrs. GC pending. Stable for discharge home.   Assessment and Plan   1. Pregnancy, location unknown   2. Abdominal pain in pregnancy   3. Blood type, Rh positive    Discharge home Follow up at [facility] in 2 days for [facility] Ectopic/return precautions Tylenol prn  Allergies as of 05/22/2018      Reactions   Penicillins Anaphylaxis   Has patient had a PCN reaction causing immediate rash, facial/tongue/throat swelling, SOB or lightheadedness with hypotension: yes Has patient had a PCN reaction causing severe rash involving mucus membranes or skin necrosis: yes Has patient had a PCN reaction that required hospitalization: unknown Has patient had a PCN reaction occurring within the last 10 years: no If all of the above answers are "NO", then may proceed with Cephalosporin use.   Sulfa Antibiotics Anaphylaxis, Hives      Medication List    STOP taking these medications   ciprofloxacin 500 MG tablet Commonly known as:  CIPRO   ibuprofen 800 MG tablet Commonly known as:  ADVIL,MOTRIN   metroNIDAZOLE 0.75 % vaginal gel Commonly known as:  METROGEL  ondansetron 4 MG disintegrating tablet Commonly known as:  ZOFRAN ODT     TAKE these medications   multivitamin-prenatal 27-0.8 MG Tabs tablet Take 1 tablet by mouth daily at 12 noon.      Donette LarryMelanie Dannisha Eckmann, CNM 05/22/2018, 9:13 PM

## 2018-05-22 NOTE — Telephone Encounter (Signed)
Patient just "took a otc pregnancy test with a positive result". She asked to "come in to be checked out".

## 2018-05-22 NOTE — MAU Note (Signed)
Has been cramping since last week. Now off and on a few times a day. Denies bleeding.

## 2018-05-22 NOTE — Telephone Encounter (Signed)
Call reviewed with Dr. Edward Jolly. Call returned to patient, advised to go directly to [facility] MAU for further evaluation of cramping with Hx of chlamydia and risk for ectopic pregnancy. Directions provided to [facility] MAU, Questions answered. OV cancelled for 1/21. Patient verbalizes understanding and is agreeable.   Routing to provider for final review. Patient is agreeable to disposition. Will close encounter.  Cc: Dr. Oscar La

## 2018-05-22 NOTE — Telephone Encounter (Signed)
Spoke with patient. LMP 04/19/18. UPT positive on 1/18 and 1/20. Reports mild intermittent "ocassional menses like cramps" 4/10 for the past couple of days. Breast tenderness. Denies any vaginal bleeding, N/V, fever. Hx of chlamydia. Patient is aware she is at risk for ectopic pregnancy, dicussed with Dr. Oscar La during previous OV.   OV scheduled with Dr. Oscar La on 1/21 at 2:30pm for pregnancy confirmation.   [facility] MAU precautions reviewed for vaginal bleeding or increased pelvic pain.   Advised I will review with covering provider and return call if any additional recommendations. Patient verbalizes understanding and is agreeable.   Routing to Dr. Edward Jolly  Cc: Dr. Oscar La

## 2018-05-22 NOTE — Discharge Instructions (Signed)
Abdominal Pain During Pregnancy ° °Abdominal pain is common during pregnancy, and has many possible causes. Some causes are more serious than others, and sometimes the cause is not known. Abdominal pain can be a sign that labor is starting. It can also be caused by normal growth and stretching of muscles and ligaments during pregnancy. Always tell your health care provider if you have any abdominal pain. °Follow these instructions at home: °· Do not have sex or put anything in your vagina until your pain goes away completely. °· Get plenty of rest until your pain improves. °· Drink enough fluid to keep your urine pale yellow. °· Take over-the-counter and prescription medicines only as told by your health care provider. °· Keep all follow-up visits as told by your health care provider. This is important. °Contact a health care provider if: °· Your pain continues or gets worse after resting. °· You have lower abdominal pain that: °? Comes and goes at regular intervals. °? Spreads to your back. °? Is similar to menstrual cramps. °· You have pain or burning when you urinate. °Get help right away if: °· You have a fever or chills. °· You have vaginal bleeding. °· You are leaking fluid from your vagina. °· You are passing tissue from your vagina. °· You have vomiting or diarrhea that lasts for more than 24 hours. °· Your baby is moving less than usual. °· You feel very weak or faint. °· You have shortness of breath. °· You develop severe pain in your upper abdomen. °Summary °· Abdominal pain is common during pregnancy, and has many possible causes. °· If you experience abdominal pain during pregnancy, tell your health care provider right away. °· Follow your health care provider's home care instructions and keep all follow-up visits as directed. °This information is not intended to replace advice given to you by your health care provider. Make sure you discuss any questions you have with your health care  provider. °Document Released: 04/19/2005 Document Revised: 07/22/2016 Document Reviewed: 07/22/2016 °Elsevier Interactive Patient Education © 2019 [employer] ° °

## 2018-05-23 ENCOUNTER — Telehealth: Payer: Self-pay | Admitting: *Deleted

## 2018-05-23 ENCOUNTER — Ambulatory Visit: Payer: 59 | Admitting: Obstetrics and Gynecology

## 2018-05-23 DIAGNOSIS — Z3201 Encounter for pregnancy test, result positive: Secondary | ICD-10-CM

## 2018-05-23 DIAGNOSIS — Z8619 Personal history of other infectious and parasitic diseases: Secondary | ICD-10-CM

## 2018-05-23 LAB — GC/CHLAMYDIA PROBE AMP (~~LOC~~) NOT AT ARMC
Chlamydia: NEGATIVE
NEISSERIA GONORRHEA: NEGATIVE

## 2018-05-23 NOTE — Telephone Encounter (Signed)
Call reviewed with Dr. Oscar La. Call returned to patient. Lab appt scheduled for 1/22 at 11:40am for STAT beta hcg, orders placed. [facility] precautions reviewed for severe pain and vaginal bleeding. Patient verbalizes understanding and is agreeable.   Routing to provider for final review. Patient is agreeable to disposition. Will close encounter.

## 2018-05-23 NOTE — Telephone Encounter (Signed)
Spoke with patient. Patient evaluated at [facility] MAU on 05/22/18, later afternoon. Positive UPT, mild cramping, Hx of chlamydia. Patient denies any pain or vaginal bleeding.   Patient was advised to f/u in office for repeat beta hcg quant in 48 hrs.   PUS: IMPRESSION: No intrauterine pregnancy identified and no adnexal masses seen. Findings consistent with pregnancy of unknown location and differential diagnosis includes an early IUP, recent spontaneous abortion, or an occult ectopic pregnancy. Clinical correlation and follow-up with HCG levels and ultrasound recommended.  Advised I will review with Dr. Oscar La and return call to schedule. Patient agreeable.   Dr. Oscar La -please review and advise on f/u.

## 2018-05-24 ENCOUNTER — Telehealth: Payer: Self-pay | Admitting: Obstetrics and Gynecology

## 2018-05-24 ENCOUNTER — Other Ambulatory Visit (INDEPENDENT_AMBULATORY_CARE_PROVIDER_SITE_OTHER): Payer: 59

## 2018-05-24 ENCOUNTER — Other Ambulatory Visit: Payer: 59

## 2018-05-24 DIAGNOSIS — Z3201 Encounter for pregnancy test, result positive: Secondary | ICD-10-CM

## 2018-05-24 DIAGNOSIS — Z8619 Personal history of other infectious and parasitic diseases: Secondary | ICD-10-CM

## 2018-05-24 DIAGNOSIS — N912 Amenorrhea, unspecified: Secondary | ICD-10-CM

## 2018-05-24 LAB — BETA HCG QUANT (REF LAB): hCG Quant: 1491 m[IU]/mL

## 2018-05-24 NOTE — Telephone Encounter (Signed)
Spoke with patient, advised of PUS appointment scheduled at [facility] on 1/24 and instructions. Lab appt scheduled at [facility] at 9:45am on 05/26/18. Patient verbalizes understanding and is agreeable.   Future lab orders placed for STAT beta Hcg.   Routing to provider for final review. Patient is agreeable to disposition. Will close encounter.

## 2018-05-24 NOTE — Telephone Encounter (Signed)
Left message to call Kaleah Hagemeister, RN at [facility] [phone].   

## 2018-05-24 NOTE — Telephone Encounter (Signed)
Spoke with patient, advised as seen below per Dr. Oscar LaJertson. Patient denies pain or bleeding, precautions reviewed. Patient verbalizes understanding and is agreeable to plan. Advised patient I will call to schedule PUS and return call, patient is agreeable and verbalizes understanding.

## 2018-05-24 NOTE — Telephone Encounter (Signed)
Left message to call Madelline Eshbach, RN at [facility] [phone].   

## 2018-05-24 NOTE — Telephone Encounter (Signed)
Spoke with Darl Pikes. Patient scheduled for PUS at [facility] on 05/26/18 at 10:30am, arriving at 10:15am. Arrive with full bladder.

## 2018-05-24 NOTE — Telephone Encounter (Signed)
Please let the patient know that her BhcG rose very well from 772 the other evening to 1491 this morning (under 48 hours). Make sure she isn't having any significant pain or bleeding. If so she needs to be seen, if not she should have another BhcG on Friday morning (stat) and ultrasound on Friday afternoon. These values must be run by one of my partners. She is at risk of ectopic.

## 2018-05-26 ENCOUNTER — Other Ambulatory Visit (INDEPENDENT_AMBULATORY_CARE_PROVIDER_SITE_OTHER): Payer: 59

## 2018-05-26 ENCOUNTER — Telehealth: Payer: Self-pay | Admitting: *Deleted

## 2018-05-26 ENCOUNTER — Ambulatory Visit (HOSPITAL_COMMUNITY)
Admission: RE | Admit: 2018-05-26 | Discharge: 2018-05-26 | Disposition: A | Discharge status: Home / Self Care | Payer: 59 | Source: Ambulatory Visit | Attending: Obstetrics and Gynecology | Admitting: Obstetrics and Gynecology

## 2018-05-26 DIAGNOSIS — Z8619 Personal history of other infectious and parasitic diseases: Secondary | ICD-10-CM

## 2018-05-26 DIAGNOSIS — Z3201 Encounter for pregnancy test, result positive: Secondary | ICD-10-CM | POA: Diagnosis not present

## 2018-05-26 LAB — BETA HCG QUANT (REF LAB): HCG QUANT: 3834 m[IU]/mL

## 2018-05-26 NOTE — Telephone Encounter (Signed)
PUS and beta Hcg results reviewed with Dr. Hyacinth Meeker, call returned to patient. Advised of PUS results and beta hcg rising appropriately, f/u PUS 7-10 days with Dr. Oscar La.   PUS 05/26/18 IMPRESSION: Single intrauterine gestational sac measuring 5 weeks 2 days by mean sac diameter. Suggest correlation with serial b-hCG levels, and consider followup ultrasound to assess viability in 10 days.   PUS scheduled for 06/06/18 at 4pm, consult to follow at 4:30pm with Dr. Oscar La. Order placed for precert.   Routing to provider for final review. Patient is agreeable to disposition. Will close encounter.   Cc: Dr. Oscar La, Julie Whitaker

## 2018-05-29 ENCOUNTER — Encounter: Payer: Self-pay | Admitting: Obstetrics and Gynecology

## 2018-05-31 ENCOUNTER — Telehealth: Payer: Self-pay | Admitting: Obstetrics and Gynecology

## 2018-05-31 NOTE — Telephone Encounter (Signed)
Spoke with patient she understands/agreeable with the benefits for ultrasound. Patient is aware of the cancellation policy.

## 2018-05-31 NOTE — Telephone Encounter (Signed)
Patient called and cancelled her ultrasound on 06/06/18. She said she is scheduled to have one at her new [facility] OB office to see Dr. Merrilee Jansky on 06/20/18.

## 2018-06-05 ENCOUNTER — Telehealth: Payer: Self-pay | Admitting: Obstetrics and Gynecology

## 2018-06-05 NOTE — Telephone Encounter (Signed)
Yes, please cancel the order

## 2018-06-05 NOTE — Telephone Encounter (Signed)
See previous phone note, which advises patient to have ultrasound with her new [facility] OB office to see Dr. Merrilee Jansky on 06/20/18.  Forwarding to provider to advise if acceptable to close OB ultrasound order.  Routing to Dr Oscar La

## 2018-06-06 ENCOUNTER — Other Ambulatory Visit: Payer: 59 | Admitting: Obstetrics and Gynecology

## 2018-06-06 ENCOUNTER — Other Ambulatory Visit: Payer: 59

## 2018-06-07 ENCOUNTER — Telehealth: Payer: Self-pay | Admitting: Obstetrics and Gynecology

## 2018-06-07 NOTE — Telephone Encounter (Signed)
Patient is calling regarding "morning sickness all day."

## 2018-06-07 NOTE — Telephone Encounter (Signed)
She can take vit B6 (pyridoxine) 25 mg with 1/2 of a Unisom (Doxylamine) tablet (12.5 mg) 3-4 x a day as needed. This come in a script but it's more expensive, cheaper to just buy it over the counter.  Warn her that it might make her sleepy.

## 2018-06-07 NOTE — Telephone Encounter (Signed)
Spoke with patient. Patient is [redacted]wks pregnant, scheduled for her first OB appt at [facility] 06/20/18. Patient states OB will not see her until 8wks.  Patient reports nausea all day. No vomiting, "dry heaves". Eating small meals/snacks q2h and is able to keep down fluids. Has tried ginger tea and ginger ale with no relief. Denies any other symptoms.   Advised I will review with Dr. Oscar La and return call, patient agreeable.   Dr. Oscar La -please review and advise.

## 2018-06-07 NOTE — Telephone Encounter (Signed)
Spoke with patient, advised as seen below per Dr. Oscar La. Patient declines RX at this time, will try OTC. Patient aware to return call to office if RX needed, keep appt with OB as scheduled. Continue small meals and fluids. [facility]U if vomiting develops or unable to keep fluids down.   Routing to provider for final review. Patient is agreeable to disposition. Will close encounter.

## 2019-07-16 ENCOUNTER — Encounter: Payer: Self-pay | Admitting: Certified Nurse Midwife

## 2019-07-18 ENCOUNTER — Encounter: Payer: Self-pay | Admitting: Certified Nurse Midwife

## 2020-09-13 IMAGING — US US OB < 14 WEEKS - US OB TV
1 series · 15 of 28 positions shown · non-contrast
Comparison: None.

CLINICAL DATA: 22-year-old female with positive HCG level
presenting with pelvic cramping.

EXAM:
OBSTETRIC <14 WK US AND TRANSVAGINAL OB US
TECHNIQUE: Both transabdominal and transvaginal ultrasound examinations were
performed for complete evaluation of the gestation as well as the
maternal uterus, adnexal regions, and pelvic cul-de-sac.
Transvaginal technique was performed to assess early pregnancy.

[Series 1: us ob < 14 weeks - us ob tv · 15 of 54 slices shown]
[im 1/54]
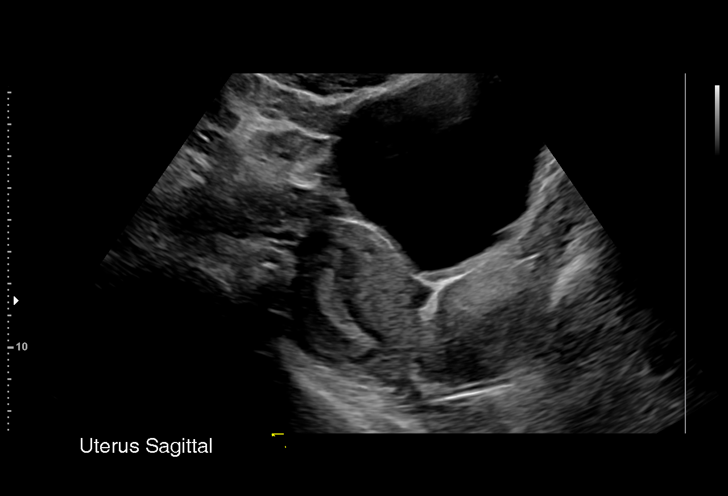
[im 4/54]
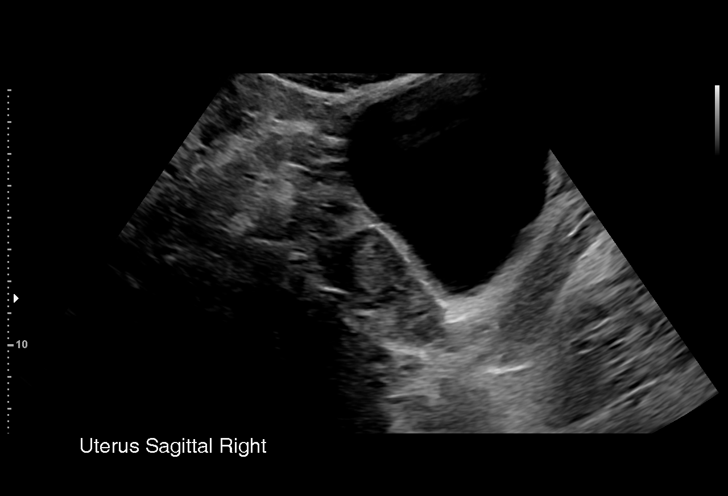
[im 8/54]
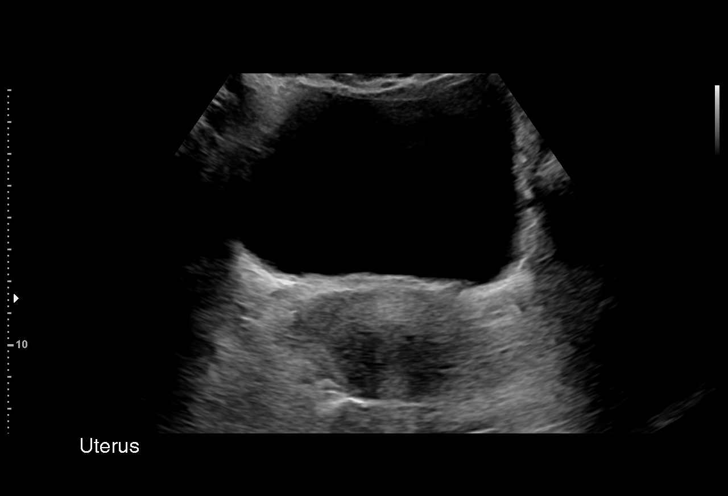
[im 12/54]
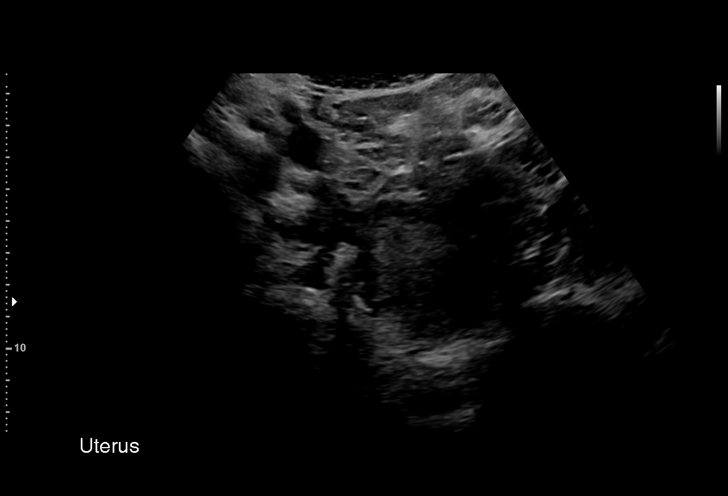
[im 16/54]
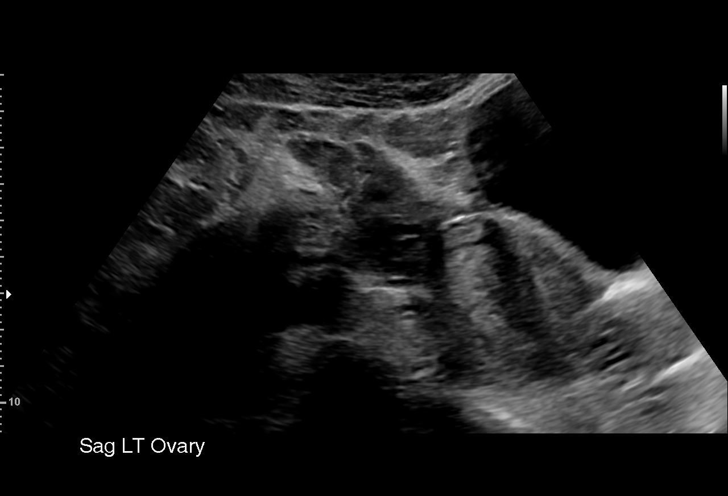
[im 20/54]
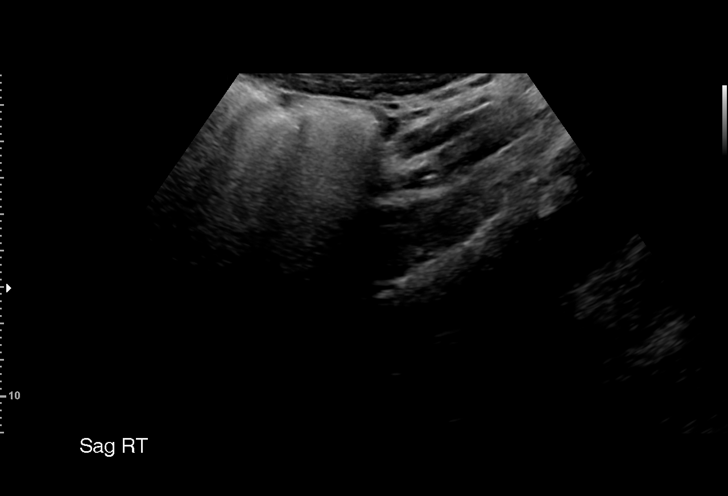
[im 24/54]
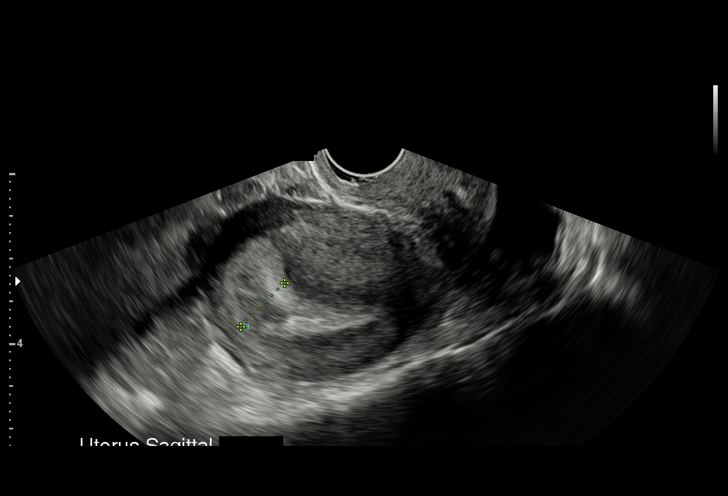
[im 28/54]
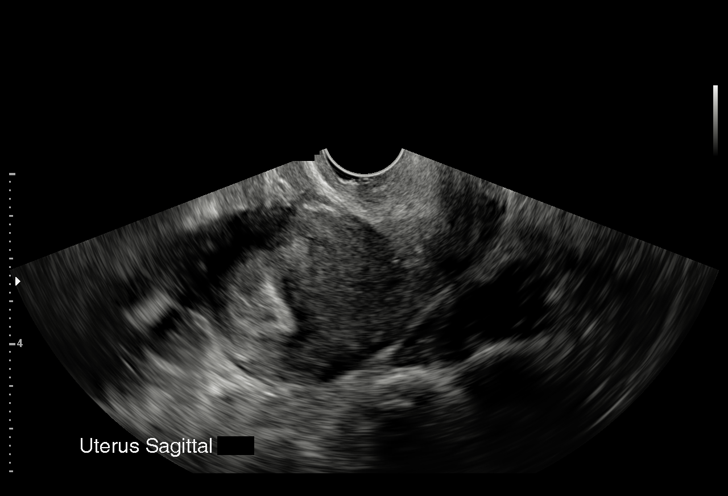
[im 30/54]
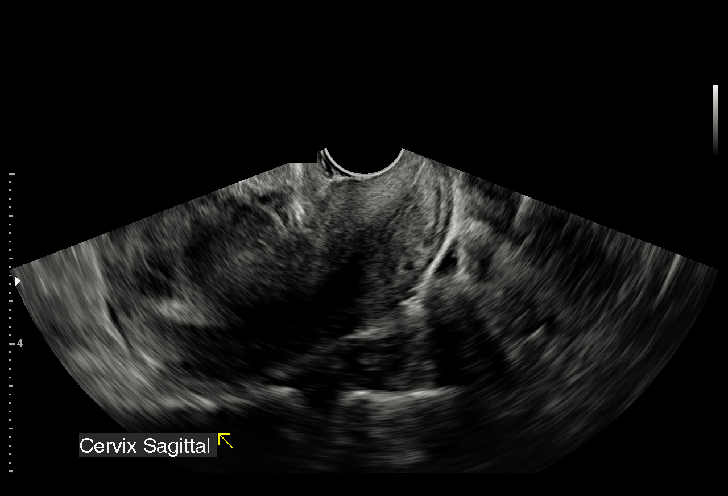
[im 34/54]
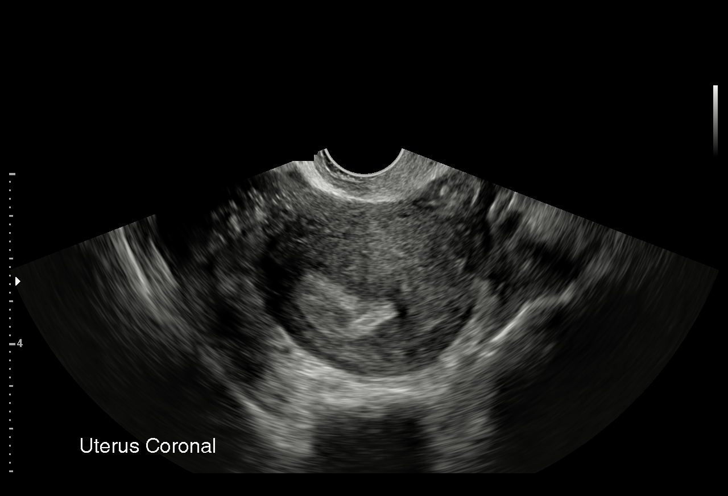
[im 38/54]
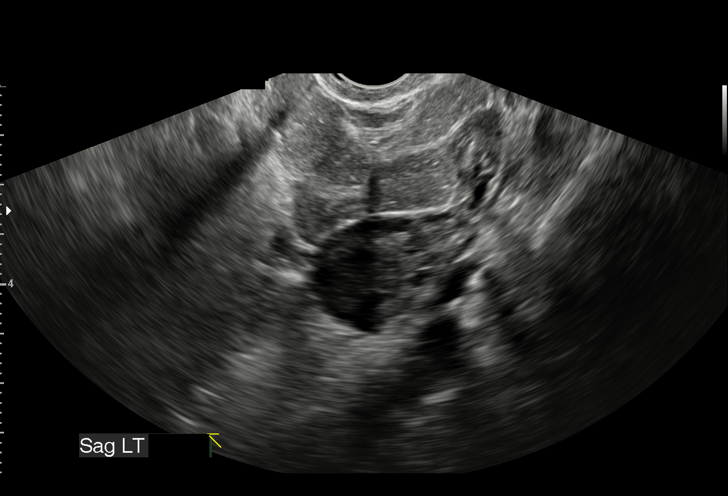
[im 42/54]
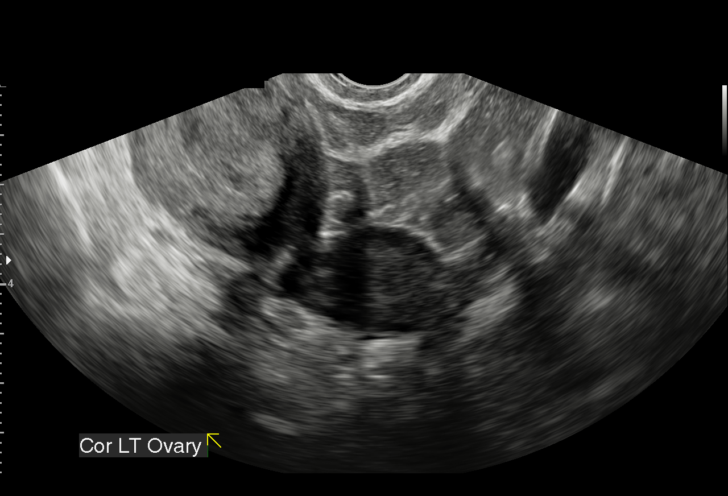
[im 46/54]
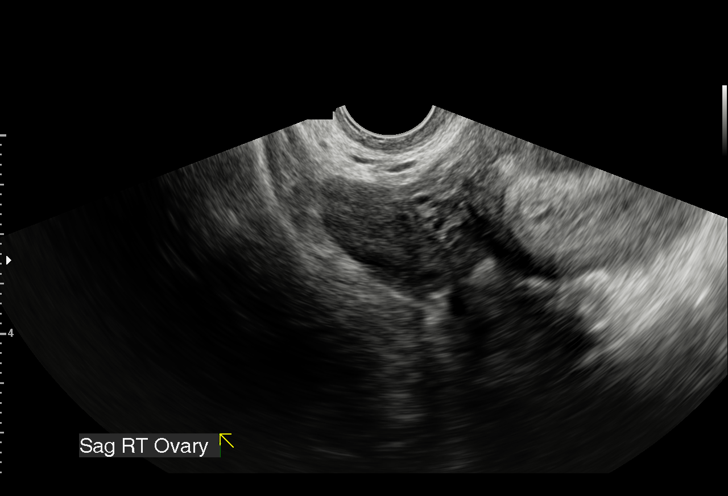
[im 50/54]
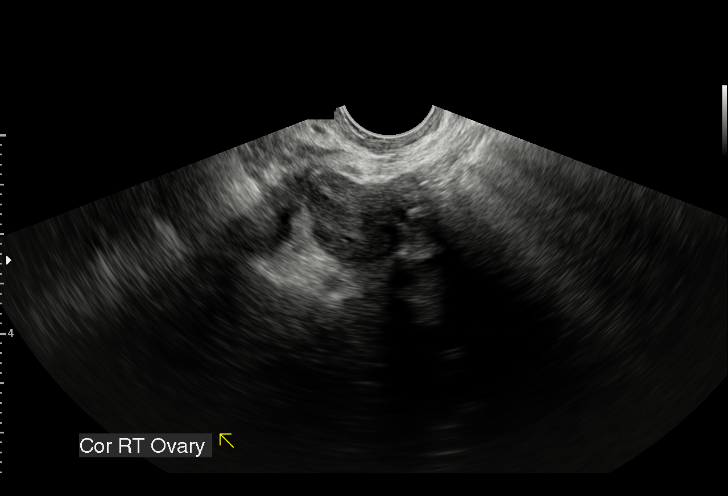
[im 54/54]
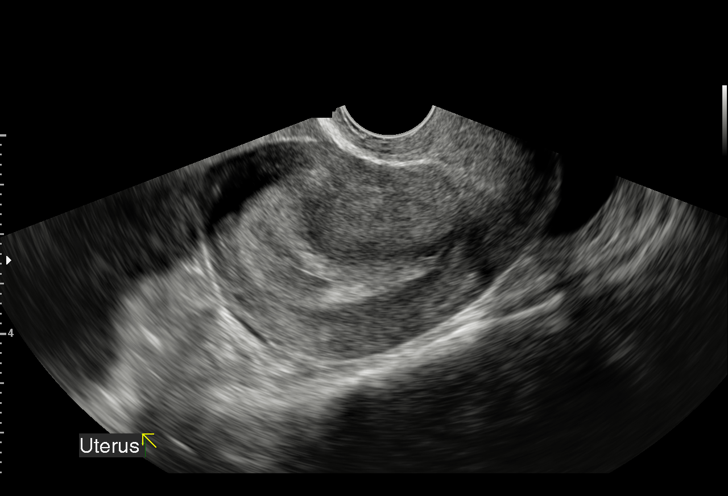

[15 of 28 positions shown; findings below may reference images not displayed]

FINDINGS: The uterus is anteverted. The endometrium is slightly heterogeneous
and measures 1.5 cm in thickness. No intrauterine pregnancy
identified. Findings represent a pregnancy of unknown location and
differential diagnosis includes an early intrauterine pregnancy,
recent spontaneous abortion, or an ectopic pregnancy. Clinical
correlation and follow-up with serial HCG levels and ultrasound
recommended.

The ovaries are unremarkable. The right ovary measures 3.3 x 1.7 x
1.4 cm and the left ovary measures 3.4 x 2.3 x 2.4 cm.

Small amount of free fluid within the pelvis.
IMPRESSION: No intrauterine pregnancy identified and no adnexal masses seen.
Findings consistent with pregnancy of unknown location and
differential diagnosis includes an early IUP, recent spontaneous
abortion, or an occult ectopic pregnancy. Clinical correlation and
follow-up with HCG levels and ultrasound recommended.

## 2020-09-17 IMAGING — US US OB TRANSVAGINAL
1 series · 15 of 28 positions shown · non-contrast
Comparison: 05/22/2018

CLINICAL DATA: 1st trimester pregnancy of unknown anatomic
location. Increased risk for ectopic pregnancy.

EXAM:
TRANSVAGINAL OB ULTRASOUND
TECHNIQUE: Transvaginal ultrasound was performed for complete evaluation of the
gestation as well as the maternal uterus, adnexal regions, and
pelvic cul-de-sac.

[Series 1: us ob transvaginal · 28 acquisitions, 15 frames shown]
[im 1/28]
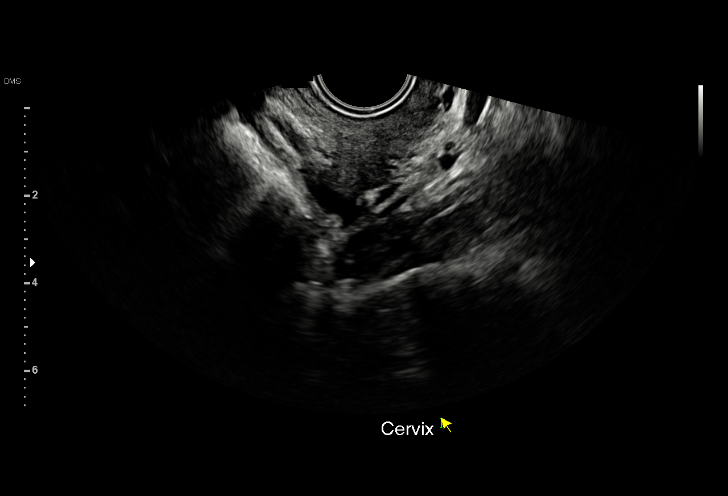
[im 3/28]
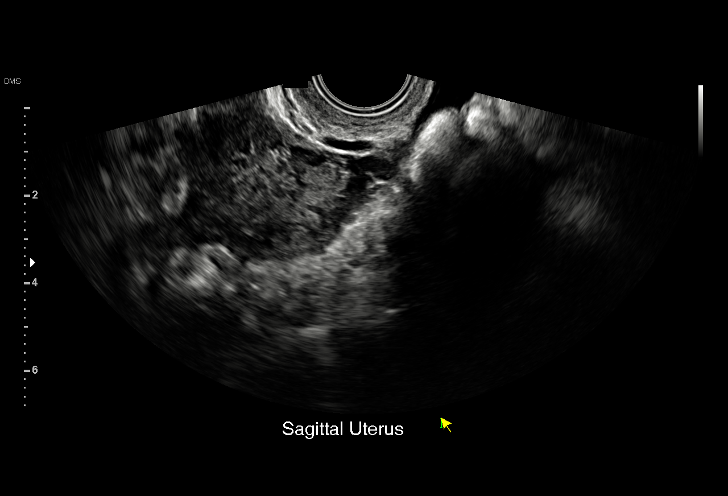
[im 5/28]
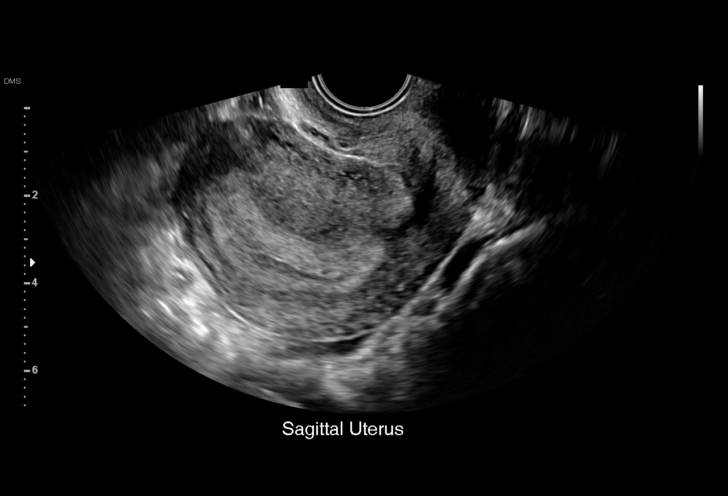
[im 7/28]
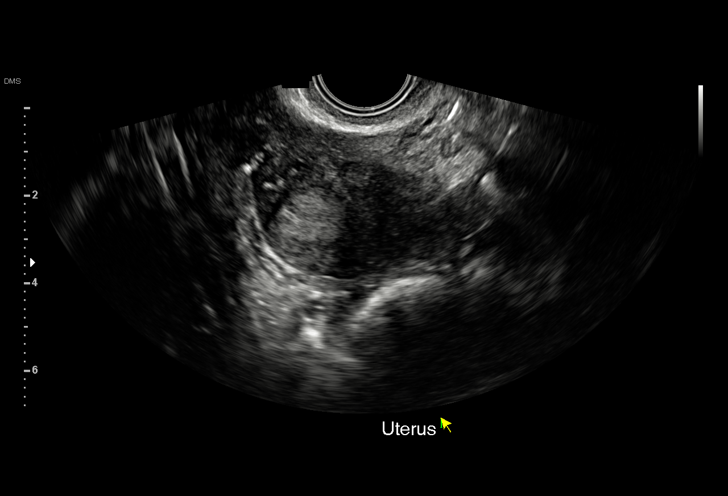
[im 9/28]
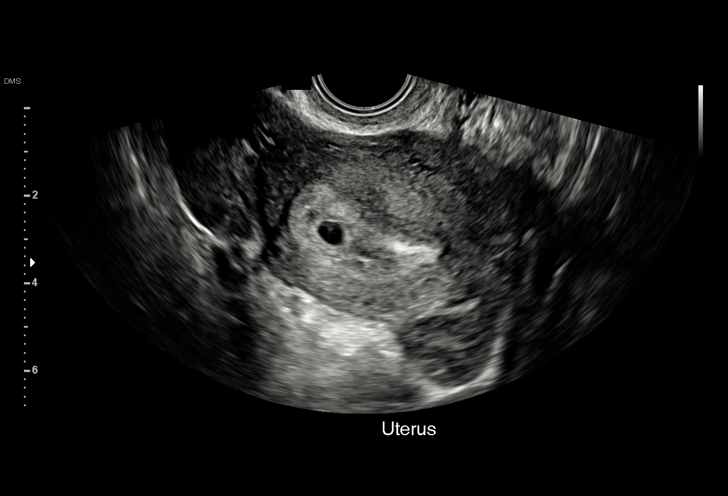
[im 11/28]
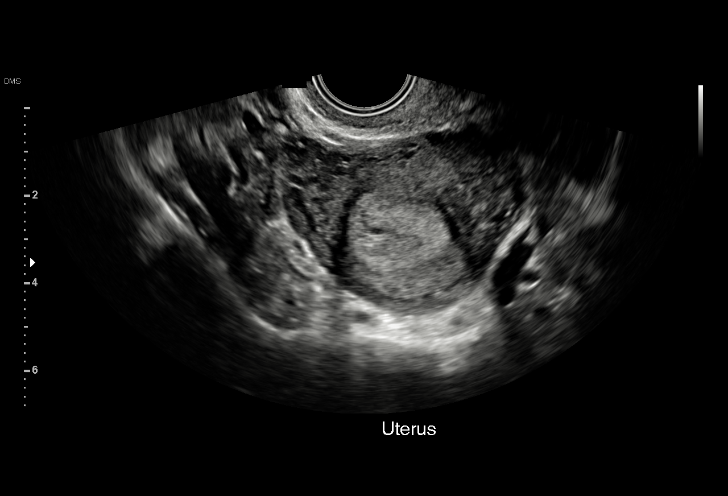
[im 13/28]
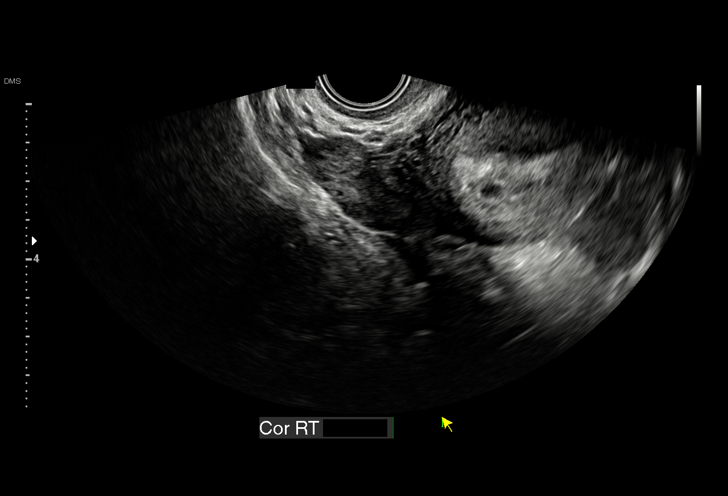
[im 15/28]
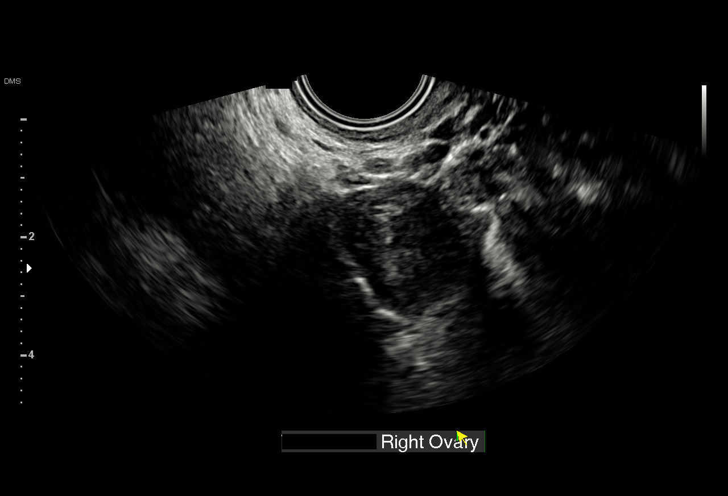
[im 16/28]
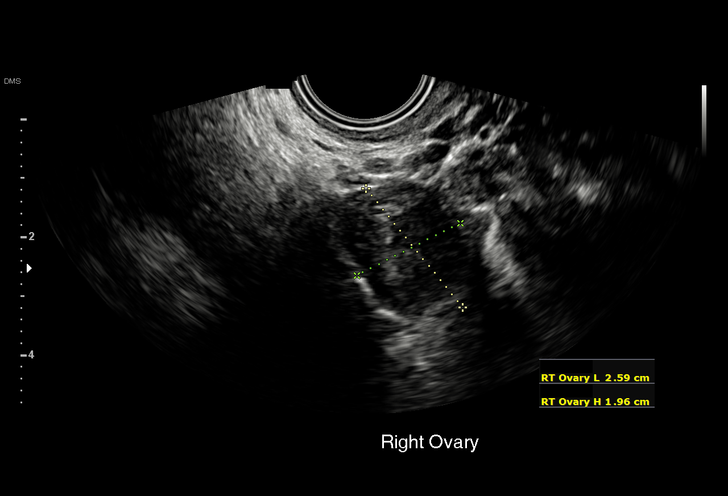
[im 18/28]
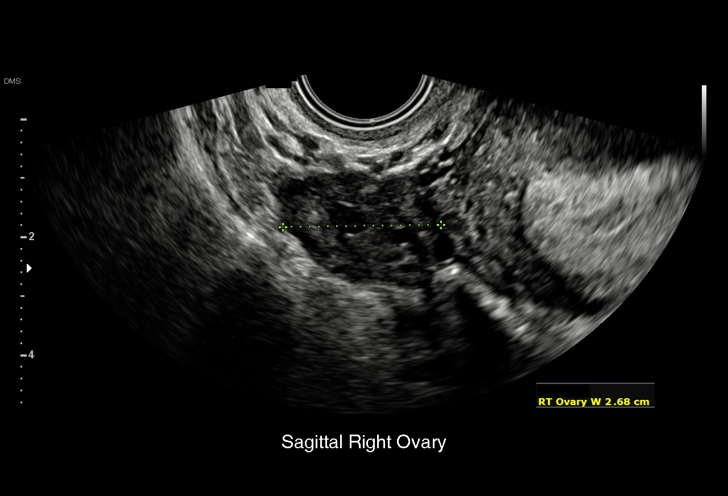
[im 20/28]
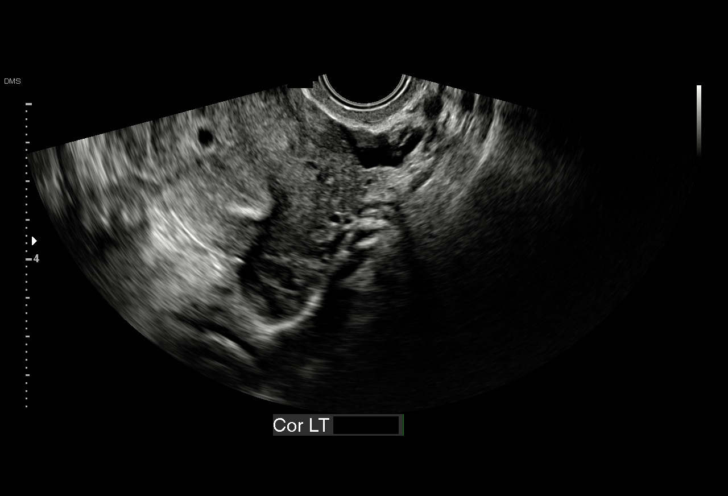
[im 22/28]
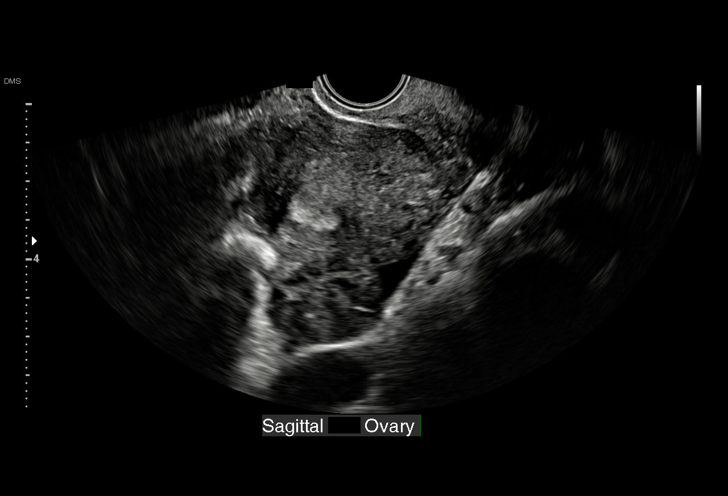
[im 24/28]
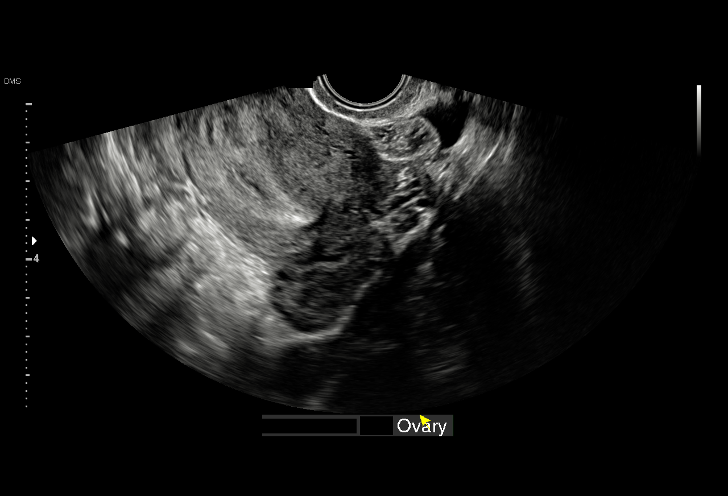
[im 26/28]
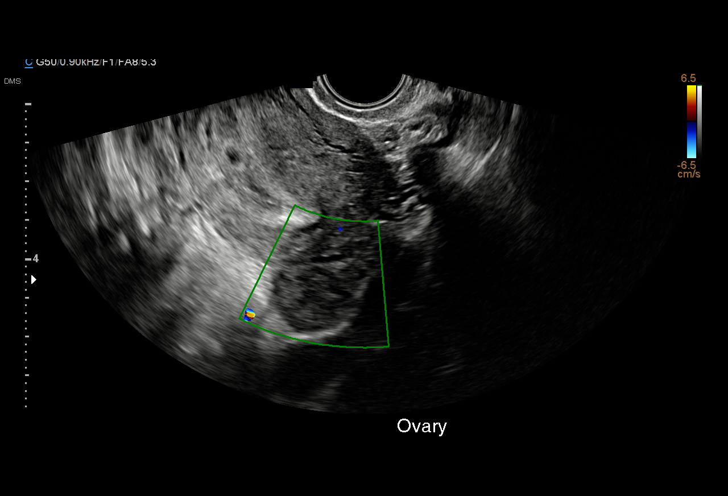
[im 28/28]
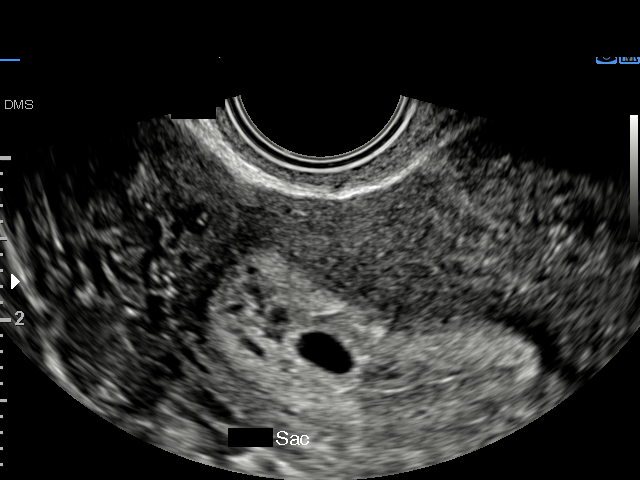

[15 of 28 positions shown; findings below may reference images not displayed]

FINDINGS: Intrauterine gestational sac: Single

Yolk sac:  Visualized.

Embryo:  Not Visualized.

MSD: 6 mm   5 w   2 d

Subchorionic hemorrhage:  None visualized.

Maternal uterus/adnexae: Both ovaries are normal in appearance. No
mass or abnormal free fluid identified.
IMPRESSION: Single intrauterine gestational sac measuring 5 weeks 2 days by mean
sac diameter. Suggest correlation with serial b-hCG levels, and
consider followup ultrasound to assess viability in 10 days.

No significant maternal uterine or adnexal abnormality identified.
